# Patient Record
Sex: Male | Born: 1970 | Race: Black or African American | Hispanic: No | Marital: Married | Smoking: Former smoker
Health system: Southern US, Community
[De-identification: ages and names within clinical notes are randomized; demographics above are authoritative.]

## PROBLEM LIST (undated history)

## (undated) DIAGNOSIS — I10 Essential (primary) hypertension: Secondary | ICD-10-CM

## (undated) DIAGNOSIS — T7840XA Allergy, unspecified, initial encounter: Secondary | ICD-10-CM

## (undated) DIAGNOSIS — E7800 Pure hypercholesterolemia, unspecified: Secondary | ICD-10-CM

## (undated) DIAGNOSIS — F528 Other sexual dysfunction not due to a substance or known physiological condition: Secondary | ICD-10-CM

## (undated) HISTORY — DX: Essential (primary) hypertension: I10

## (undated) HISTORY — DX: Pure hypercholesterolemia, unspecified: E78.00

## (undated) HISTORY — DX: Allergy, unspecified, initial encounter: T78.40XA

## (undated) HISTORY — DX: Morbid (severe) obesity due to excess calories: E66.01

## (undated) HISTORY — PX: WISDOM TOOTH EXTRACTION: SHX21

## (undated) HISTORY — DX: Other sexual dysfunction not due to a substance or known physiological condition: F52.8

---

## 2005-06-08 ENCOUNTER — Ambulatory Visit: Payer: Self-pay | Admitting: Internal Medicine

## 2005-07-21 ENCOUNTER — Ambulatory Visit: Payer: Self-pay | Admitting: Internal Medicine

## 2006-01-19 ENCOUNTER — Ambulatory Visit: Payer: Self-pay | Admitting: Internal Medicine

## 2006-07-24 ENCOUNTER — Ambulatory Visit: Payer: Self-pay | Admitting: Internal Medicine

## 2006-07-24 LAB — CONVERTED CEMR LAB
ALT: 22 units/L (ref 0–40)
CO2: 31 meq/L (ref 19–32)
Calcium: 9.6 mg/dL (ref 8.4–10.5)
Chloride: 101 meq/L (ref 96–112)
Chol/HDL Ratio, serum: 7.7
Cholesterol: 284 mg/dL (ref 0–200)
Creatinine, Ser: 1.2 mg/dL (ref 0.4–1.5)
Glucose, Bld: 95 mg/dL (ref 70–99)
HDL: 37 mg/dL — ABNORMAL LOW (ref >39.0)
LDL DIRECT: 231.1 mg/dL
Sodium: 140 meq/L (ref 135–145)
Triglyceride fasting, serum: 108 mg/dL (ref 0–149)
VLDL: 22 mg/dL (ref 0–40)

## 2007-05-10 DIAGNOSIS — I10 Essential (primary) hypertension: Secondary | ICD-10-CM

## 2007-05-10 HISTORY — DX: Essential (primary) hypertension: I10

## 2007-10-07 ENCOUNTER — Telehealth: Payer: Self-pay | Admitting: Internal Medicine

## 2008-04-24 ENCOUNTER — Ambulatory Visit: Payer: Self-pay | Admitting: Internal Medicine

## 2008-04-24 DIAGNOSIS — E7800 Pure hypercholesterolemia, unspecified: Secondary | ICD-10-CM | POA: Insufficient documentation

## 2008-04-24 HISTORY — DX: Pure hypercholesterolemia, unspecified: E78.00

## 2008-04-24 LAB — CONVERTED CEMR LAB
AST: 21 units/L (ref 0–37)
Direct LDL: 157 mg/dL — ABNORMAL HIGH
HDL: 39 mg/dL — ABNORMAL LOW (ref >39)
Triglycerides: 91 mg/dL (ref <150)

## 2008-08-21 ENCOUNTER — Ambulatory Visit: Payer: Self-pay | Admitting: Internal Medicine

## 2008-08-21 DIAGNOSIS — F528 Other sexual dysfunction not due to a substance or known physiological condition: Secondary | ICD-10-CM

## 2008-08-21 HISTORY — DX: Other sexual dysfunction not due to a substance or known physiological condition: F52.8

## 2008-08-25 LAB — CONVERTED CEMR LAB
Cholesterol: 227 mg/dL (ref 0–200)
Direct LDL: 177.6 mg/dL
Testosterone: 586.19 ng/dL (ref 350.00–890)

## 2008-11-18 ENCOUNTER — Telehealth: Payer: Self-pay | Admitting: Internal Medicine

## 2009-09-01 ENCOUNTER — Telehealth: Payer: Self-pay | Admitting: Internal Medicine

## 2009-09-13 ENCOUNTER — Ambulatory Visit: Payer: Self-pay | Admitting: Internal Medicine

## 2009-09-15 LAB — CONVERTED CEMR LAB
AST: 24 units/L (ref 0–37)
Direct LDL: 170 mg/dL
HDL: 26.9 mg/dL — ABNORMAL LOW (ref >39.00)
Triglycerides: 162 mg/dL — ABNORMAL HIGH (ref 0.0–149.0)

## 2009-09-22 ENCOUNTER — Telehealth: Payer: Self-pay | Admitting: Internal Medicine

## 2009-10-11 ENCOUNTER — Telehealth: Payer: Self-pay | Admitting: Internal Medicine

## 2010-10-04 NOTE — Progress Notes (Signed)
Summary: cough  Phone Note Call from Patient   Caller: Patient Call For: Gordy Savers  MD Summary of Call: [facility] Needs refill or different Rx for coughing.  Message left on Triage. [phone] Initial call taken by: Lynann Beaver CMA,  October 11, 2009 11:48 AM  Follow-up for Phone Call        generic Hydromet, 6-ounce, 1 teaspoon every 6 hours as needed for cough Follow-up by: Gordy Savers  MD,  October 11, 2009 12:35 PM     Appended Document: cough Patient called saying Rx is not at [facility] [address]  Called in.

## 2010-10-04 NOTE — Assessment & Plan Note (Signed)
Summary: fu on bp/njr   Vital Signs:  Patient profile:   40 year old male Weight:      288 pounds BP sitting:   150 / 90  (right arm) Cuff size:   large  Vitals Entered By: Raechel Ache, RN (September 13, 2009 8:21 AM) CC: F/u BP.   CC:  F/u BP.Marland Kitchen  History of Present Illness: 40 year old patient who is included for follow-up of his hypertension and dyslipidemia.  He does monitor blood pressures at home with readings from normal range  to  mildly elevated.  he feels well.  No concerns or complaints.  He does have a history of ED controlled well on medication.  He has recovered from a URI.  Allergies: No Known Drug Allergies  Past History:  Past Medical History: Reviewed history from 05/10/2007 and no changes required. Allergies High Cholesterol Hypertension  Review of Systems  The patient denies anorexia, fever, weight loss, weight gain, vision loss, decreased hearing, hoarseness, chest pain, syncope, dyspnea on exertion, peripheral edema, prolonged cough, headaches, hemoptysis, abdominal pain, melena, hematochezia, severe indigestion/heartburn, hematuria, incontinence, genital sores, muscle weakness, suspicious skin lesions, transient blindness, difficulty walking, depression, unusual weight change, abnormal bleeding, enlarged lymph nodes, angioedema, breast masses, and testicular masses.    Physical Exam  General:  overweight-appearing.  blood pressure as high as 160/96overweight-appearing.   Head:  Normocephalic and atraumatic without obvious abnormalities. No apparent alopecia or balding. Eyes:  No corneal or conjunctival inflammation noted. EOMI. Perrla. Funduscopic exam benign, without hemorrhages, exudates or papilledema. Vision grossly normal. Mouth:  Oral mucosa and oropharynx without lesions or exudates.  Teeth in good repair. Neck:  No deformities, masses, or tenderness noted. Lungs:  Normal respiratory effort, chest expands symmetrically. Lungs are clear to  auscultation, no crackles or wheezes. Heart:  Normal rate and regular rhythm. S1 and S2 normal without gallop, murmur, click, rub or other extra sounds. Msk:  No deformity or scoliosis noted of thoracic or lumbar spine.   Pulses:  R and L carotid,radial,femoral,dorsalis pedis and posterior tibial pulses are full and equal bilaterally Extremities:  No clubbing, cyanosis, edema, or deformity noted with normal full range of motion of all joints.     Impression & Recommendations:  Problem # 1:  HYPERTENSION (ICD-401.9)  The following medications were removed from the medication list:    Hydrochlorothiazide 25 Mg Tabs (Hydrochlorothiazide) .Marland Kitchen... 1 once daily His updated medication list for this problem includes:    Lisinopril-hydrochlorothiazide 20-25 Mg Tabs (Lisinopril-hydrochlorothiazide) ..... One daily  The following medications were removed from the medication list:    Hydrochlorothiazide 25 Mg Tabs (Hydrochlorothiazide) .Marland Kitchen... 1 once daily His updated medication list for this problem includes:    Lisinopril-hydrochlorothiazide 20-25 Mg Tabs (Lisinopril-hydrochlorothiazide) ..... One daily  Problem # 2:  PURE HYPERCHOLESTEROLEMIA (ICD-272.0)  His updated medication list for this problem includes:    Simvastatin 40 Mg Tabs (Simvastatin) .Marland Kitchen... 1 at bedtime    His updated medication list for this problem includes:    Simvastatin 40 Mg Tabs (Simvastatin) .Marland Kitchen... 1 at bedtime  Complete Medication List: 1)  Simvastatin 40 Mg Tabs (Simvastatin) .Marland Kitchen.. 1 at bedtime 2)  Cialis 20 Mg Tabs (Tadalafil) .... One daily as directed 3)  Viagra 100 Mg Tabs (Sildenafil citrate) .... 1/2-1 as needed 4)  Lisinopril-hydrochlorothiazide 20-25 Mg Tabs (Lisinopril-hydrochlorothiazide) .... One daily  Other Orders: Venipuncture ([address]) TLB-AST (SGOT) (84450-SGOT) TLB-Lipid Panel (80061-LIPID)  Patient Instructions: 1)  Please schedule a follow-up appointment in 6 months. 2)  Limit  your Sodium  (Salt). 3)  It is important that you exercise regularly at least 20 minutes 5 times a week. If you develop chest pain, have severe difficulty breathing, or feel very tired , stop exercising immediately and seek medical attention. 4)  You need to lose weight. Consider a lower calorie diet and regular exercise.  5)  Check your Blood Pressure regularly. If it is above: 150/90 you should make an appointment. Prescriptions: LISINOPRIL-HYDROCHLOROTHIAZIDE 20-25 MG TABS (LISINOPRIL-HYDROCHLOROTHIAZIDE) one daily  #90 x 6   Entered and Authorized by:   Gordy Savers  MD   Signed by:   Gordy Savers  MD on 09/13/2009   Method used:   Print then Give to Patient   RxID:   [id number] VIAGRA 100 MG TABS (SILDENAFIL CITRATE) 1/2-1 as needed  #6 x 6   Entered and Authorized by:   Gordy Savers  MD   Signed by:   Gordy Savers  MD on 09/13/2009   Method used:   Print then Give to Patient   RxID:   [id number] CIALIS 20 MG TABS (TADALAFIL) one daily as directed  #12 x 6   Entered and Authorized by:   Gordy Savers  MD   Signed by:   Gordy Savers  MD on 09/13/2009   Method used:   Print then Give to Patient   RxID:   [id number] SIMVASTATIN 40 MG  TABS (SIMVASTATIN) 1 at bedtime  #90.0 Each x 6   Entered and Authorized by:   Gordy Savers  MD   Signed by:   Gordy Savers  MD on 09/13/2009   Method used:   Print then Give to Patient   RxID:   [id number]

## 2010-10-04 NOTE — Progress Notes (Signed)
Summary: RX for cough  Phone Note Call from Patient   Caller: Patient Call For: Gordy Savers  MD Summary of Call: Pt had the flu last week, and feels better, but is still coughing.  Would like Rx called to [facility]).  Initial call taken by: Lynann Beaver CMA,  September 22, 2009 3:17 PM    New/Updated Medications: HYDROMET 5-1.5 MG/5ML SYRP (HYDROCODONE-HOMATROPINE) one teaspoon q 6 hrs as needed cough Prescriptions: HYDROMET 5-1.5 MG/5ML SYRP (HYDROCODONE-HOMATROPINE) one teaspoon q 6 hrs as needed cough  #6 oz. x 0   Entered by:   Lynann Beaver CMA   Authorized by:   Gordy Savers  MD   Signed by:   Lynann Beaver CMA on 09/23/2009   Method used:   Telephoned to ...       [facility] [address] [phone]* (retail)       [address]       [place], [place]  [address]       Ph: [phone]       Fax: [phone]   RxID:   [id number]  generic hydromet 6 oz one tsp every 6 and hrs for cough

## 2010-11-07 ENCOUNTER — Encounter: Payer: Self-pay | Admitting: Internal Medicine

## 2010-11-07 ENCOUNTER — Ambulatory Visit (INDEPENDENT_AMBULATORY_CARE_PROVIDER_SITE_OTHER): Payer: Self-pay | Admitting: Internal Medicine

## 2010-11-07 VITALS — BP 130/80 | Temp 99.2°F | Wt 288.0 lb

## 2010-11-07 DIAGNOSIS — I10 Essential (primary) hypertension: Secondary | ICD-10-CM

## 2010-11-07 MED ORDER — PREDNISONE 20 MG PO TABS: 20.0000 mg | ORAL_TABLET | Freq: Every day | ORAL | Status: AC

## 2010-11-07 MED ORDER — FLUTICASONE PROPIONATE 50 MCG/ACT NA SUSP: 1.0000 | Freq: Every day | NASAL | Status: DC

## 2010-11-07 NOTE — Patient Instructions (Signed)
Get plenty of rest, Drink lots of  clear liquids, and use Tylenol or ibuprofen for fever and discomfort.    Call or return to clinic prn if these symptoms worsen or fail to improve as anticipated.  Return in 3 months for follow-up  Please check your blood pressure on a regular basis.  If it is consistently greater than 150/90, please make an office appointment.

## 2010-11-07 NOTE — Progress Notes (Signed)
  Subjective:    Patient ID: Adam Adkins, male    DOB: 07/19/1971, 40 y.o.   MRN: [id number]  HPI   1 -year-old patient who has a history of  Hypertension as well as allergic rhinitis. For the past week he has had increasing sinus congestion and cough he has had low-grade fever. He has been using Zyrtec-D and also nasal decongestants. His blood pressure has been well-controlled. He denies any purulent nasal secretions or purulent productive cough no wheezing he has had some mild sore throat he also has a history of   Review of Systems  Constitutional: Negative for fever, chills, appetite change and fatigue.  HENT: Positive for congestion, rhinorrhea and postnasal drip. Negative for hearing loss, ear pain, sore throat, trouble swallowing, neck stiffness, dental problem, voice change and tinnitus.   Eyes: Negative for pain, discharge and visual disturbance.  Respiratory: Positive for cough. Negative for chest tightness, wheezing and stridor.   Cardiovascular: Negative for chest pain, palpitations and leg swelling.  Gastrointestinal: Negative for nausea, vomiting, abdominal pain, diarrhea, constipation, blood in stool and abdominal distention.  Genitourinary: Negative for urgency, hematuria, flank pain, discharge, difficulty urinating and genital sores.  Musculoskeletal: Negative for myalgias, back pain, joint swelling, arthralgias and gait problem.  Skin: Negative for rash.  Neurological: Negative for dizziness, syncope, speech difficulty, weakness, numbness and headaches.  Hematological: Negative for adenopathy. Does not bruise/bleed easily.  Psychiatric/Behavioral: Negative for behavioral problems and dysphoric mood. The patient is not nervous/anxious.        Objective:   Physical Exam  Constitutional: He is oriented to person, place, and time. He appears well-developed and well-nourished.        Overweight no distress  HENT:  Head: Normocephalic.  Right Ear: External ear normal.  Left  Ear: External ear normal.  Eyes: Conjunctivae and EOM are normal.  Neck: Normal range of motion.  Cardiovascular: Normal rate and normal heart sounds.   Pulmonary/Chest: Breath sounds normal.  Abdominal: Bowel sounds are normal.  Musculoskeletal: Normal range of motion. He exhibits no edema and no tenderness.  Neurological: He is alert and oriented to person, place, and time.  Psychiatric: He has a normal mood and affect. His behavior is normal.          Assessment & Plan:   URI-   We'll treat symptomatically. Samples prescribed  Hypertension stable  Dyslipidemia

## 2010-12-16 ENCOUNTER — Encounter: Payer: Self-pay | Admitting: Internal Medicine

## 2010-12-16 ENCOUNTER — Ambulatory Visit (INDEPENDENT_AMBULATORY_CARE_PROVIDER_SITE_OTHER): Payer: BLUE CROSS/BLUE SHIELD | Admitting: Internal Medicine

## 2010-12-16 DIAGNOSIS — J309 Allergic rhinitis, unspecified: Secondary | ICD-10-CM

## 2010-12-16 DIAGNOSIS — I10 Essential (primary) hypertension: Secondary | ICD-10-CM

## 2010-12-16 NOTE — Progress Notes (Signed)
  Subjective:    Patient ID: Adam Adkins, male    DOB: 12/13/1970, 40 y.o.   MRN: [id number]  HPI 40 year old patient who has a history of hypertension and allergic rhinitis. He was seen here 5 weeks ago with sore throat head and chest congestion. He remains on Flonase and was treated symptomatically for his cough. He continues to have nasal congestion postnasal drip and a chief complaint of cough. There's been no fever   Review of Systems  Constitutional: Positive for fatigue. Negative for fever, chills and appetite change.  HENT: Positive for congestion and rhinorrhea. Negative for hearing loss, ear pain, sore throat, trouble swallowing, neck stiffness, dental problem, voice change and tinnitus.   Eyes: Negative for pain, discharge and visual disturbance.  Respiratory: Positive for cough. Negative for chest tightness, wheezing and stridor.   Cardiovascular: Negative for chest pain, palpitations and leg swelling.  Gastrointestinal: Negative for nausea, vomiting, abdominal pain, diarrhea, constipation, blood in stool and abdominal distention.  Genitourinary: Negative for urgency, hematuria, flank pain, discharge, difficulty urinating and genital sores.  Musculoskeletal: Negative for myalgias, back pain, joint swelling, arthralgias and gait problem.  Skin: Negative for rash.  Neurological: Negative for dizziness, syncope, speech difficulty, weakness, numbness and headaches.  Hematological: Negative for adenopathy. Does not bruise/bleed easily.  Psychiatric/Behavioral: Negative for behavioral problems and dysphoric mood. The patient is not nervous/anxious.        Objective:   Physical Exam  Constitutional: He is oriented to person, place, and time. He appears well-developed.  HENT:  Head: Normocephalic.  Right Ear: External ear normal.  Left Ear: External ear normal.  Eyes: Conjunctivae and EOM are normal.  Neck: Normal range of motion.  Cardiovascular: Normal rate and normal heart  sounds.   Pulmonary/Chest: Breath sounds normal.  Abdominal: Bowel sounds are normal.  Musculoskeletal: Normal range of motion. He exhibits no edema and no tenderness.  Neurological: He is alert and oriented to person, place, and time.  Psychiatric: He has a normal mood and affect. His behavior is normal.          Assessment & Plan:  \ Allergic rhinitis. Pharyngitis. We'll treat with a prednisone Dosepak continue Nasonex and Allegra-D. Will call if unimproved

## 2010-12-16 NOTE — Patient Instructions (Signed)
Prednisone Dosepak as directed  Call or return to clinic prn if these symptoms worsen or fail to improve as anticipated.

## 2010-12-31 ENCOUNTER — Other Ambulatory Visit: Payer: Self-pay | Admitting: Internal Medicine

## 2011-01-10 ENCOUNTER — Telehealth: Payer: Self-pay | Admitting: *Deleted

## 2011-01-10 NOTE — Telephone Encounter (Signed)
Pt is complaining of "a build of mucus in his throat", and wants to know if there is anything he can take for it.  He feels it is related to his allergies. Sounds like he is describing post nasal drainage.

## 2011-01-10 NOTE — Telephone Encounter (Signed)
Suggest alavert daily

## 2011-01-10 NOTE — Telephone Encounter (Signed)
Pt given Dr. Charm Rings recommendations on his personal voice mail.

## 2011-02-02 ENCOUNTER — Encounter: Payer: Self-pay | Admitting: Internal Medicine

## 2011-02-02 ENCOUNTER — Ambulatory Visit (INDEPENDENT_AMBULATORY_CARE_PROVIDER_SITE_OTHER): Payer: BC Managed Care – PPO | Admitting: Internal Medicine

## 2011-02-02 DIAGNOSIS — I10 Essential (primary) hypertension: Secondary | ICD-10-CM

## 2011-02-02 NOTE — Progress Notes (Signed)
  Subjective:    Patient ID: Adam Adkins, male    DOB: August 30, 1971, 40 y.o.   MRN: [id number]  HPI  40 year old patient who presents with a 3 to four-day history of fullness in the left ear. He does have a history of allergic rhinitis and has been on maintenance Flonase as well as Alavert. He has hypertension and treated dyslipidemia. He also describes some fullness in the throat as well as the sinus area. Denies much in the way of cough or postnasal drip. No fever or purulent reductive cough. Denies any wheezing or shortness of breath    Review of Systems  Constitutional: Negative for fever, chills, appetite change and fatigue.  HENT: Positive for ear pain. Negative for hearing loss, congestion, sore throat, trouble swallowing, neck stiffness, dental problem, voice change and tinnitus.   Eyes: Negative for pain, discharge and visual disturbance.  Respiratory: Negative for cough, chest tightness, wheezing and stridor.   Cardiovascular: Negative for chest pain, palpitations and leg swelling.  Gastrointestinal: Negative for nausea, vomiting, abdominal pain, diarrhea, constipation, blood in stool and abdominal distention.  Genitourinary: Negative for urgency, hematuria, flank pain, discharge, difficulty urinating and genital sores.  Musculoskeletal: Negative for myalgias, back pain, joint swelling, arthralgias and gait problem.  Skin: Negative for rash.  Neurological: Negative for dizziness, syncope, speech difficulty, weakness, numbness and headaches.  Hematological: Negative for adenopathy. Does not bruise/bleed easily.  Psychiatric/Behavioral: Negative for behavioral problems and dysphoric mood. The patient is not nervous/anxious.        Objective:   Physical Exam  Constitutional: He is oriented to person, place, and time. He appears well-developed.  HENT:  Head: Normocephalic.  Right Ear: External ear normal.  Left Ear: External ear normal.  Mouth/Throat: Oropharynx is clear and moist.         Left ear is normal  Eyes: Conjunctivae and EOM are normal.  Neck: Normal range of motion.  Cardiovascular: Normal rate and normal heart sounds.   Pulmonary/Chest: Breath sounds normal.  Abdominal: Bowel sounds are normal.  Musculoskeletal: Normal range of motion. He exhibits no edema and no tenderness.  Neurological: He is alert and oriented to person, place, and time.  Psychiatric: He has a normal mood and affect. His behavior is normal.          Assessment & Plan:   Left otalgia. We'll clinically observe at the present time; will add a decongestant to his regimen Hypertension stable

## 2011-02-02 NOTE — Patient Instructions (Signed)
Call or return to clinic prn if these symptoms worsen or fail to improve as anticipated.

## 2011-04-20 ENCOUNTER — Other Ambulatory Visit: Payer: Self-pay | Admitting: Internal Medicine

## 2011-05-10 ENCOUNTER — Ambulatory Visit: Payer: Self-pay | Admitting: Internal Medicine

## 2011-05-11 ENCOUNTER — Telehealth: Payer: Self-pay | Admitting: Internal Medicine

## 2011-05-11 MED ORDER — VARDENAFIL HCL 20 MG PO TABS: 20.0000 mg | ORAL_TABLET | ORAL | Status: DC | PRN

## 2011-05-11 NOTE — Telephone Encounter (Signed)
done

## 2011-05-11 NOTE — Telephone Encounter (Signed)
Pt said that Dr Amador Cunas had given pt some samples of Levitra to try. Pt is req a script to be called in to [facility] on [address]

## 2011-05-11 NOTE — Telephone Encounter (Signed)
Please advise what mg ?

## 2011-05-11 NOTE — Telephone Encounter (Signed)
20 mg  #12  RF 4

## 2011-08-31 ENCOUNTER — Telehealth: Payer: Self-pay | Admitting: *Deleted

## 2011-08-31 NOTE — Telephone Encounter (Signed)
Triage message from pt who is out of town for 2 more weeks.  He has hx of ringworm and has on his arm again.  He has tried OTC tinactin which did help for a time, however it is back.  Pt wife will be joining him this weekend and is requesting another suggestion and or new Rx for ringworm that can picked-up and brought to him. [facility] [address]

## 2011-09-01 NOTE — Telephone Encounter (Signed)
Spoke with pt- informed of dr. kwiatkowski's instructions 

## 2011-09-01 NOTE — Telephone Encounter (Signed)
Try OTC lamisil bid

## 2011-10-06 ENCOUNTER — Ambulatory Visit (INDEPENDENT_AMBULATORY_CARE_PROVIDER_SITE_OTHER): Payer: BC Managed Care – PPO | Admitting: Internal Medicine

## 2011-10-06 ENCOUNTER — Encounter: Payer: Self-pay | Admitting: Internal Medicine

## 2011-10-06 DIAGNOSIS — F528 Other sexual dysfunction not due to a substance or known physiological condition: Secondary | ICD-10-CM

## 2011-10-06 DIAGNOSIS — E7800 Pure hypercholesterolemia, unspecified: Secondary | ICD-10-CM

## 2011-10-06 DIAGNOSIS — J069 Acute upper respiratory infection, unspecified: Secondary | ICD-10-CM

## 2011-10-06 DIAGNOSIS — I10 Essential (primary) hypertension: Secondary | ICD-10-CM

## 2011-10-06 MED ORDER — LISINOPRIL-HYDROCHLOROTHIAZIDE 20-25 MG PO TABS: 1.0000 | ORAL_TABLET | Freq: Every day | ORAL | Status: DC

## 2011-10-06 MED ORDER — SIMVASTATIN 40 MG PO TABS: 40.0000 mg | ORAL_TABLET | Freq: Every day | ORAL | Status: DC

## 2011-10-06 MED ORDER — FLUTICASONE PROPIONATE 50 MCG/ACT NA SUSP: 1.0000 | Freq: Every day | NASAL | Status: DC

## 2011-10-06 MED ORDER — VARDENAFIL HCL 20 MG PO TABS: 20.0000 mg | ORAL_TABLET | ORAL | Status: DC | PRN

## 2011-10-06 MED ORDER — HYDROCODONE-HOMATROPINE 5-1.5 MG/5ML PO SYRP: 5.0000 mL | ORAL_SOLUTION | Freq: Four times a day (QID) | ORAL | Status: DC | PRN

## 2011-10-06 NOTE — Progress Notes (Signed)
  Subjective:    Patient ID: Adam Adkins, male    DOB: 1970/10/24, 41 y.o.   MRN: [id number]  HPI  Wt Readings from Last 3 Encounters:  10/06/11 303 lb (137.44 kg)  02/02/11 295 lb (133.811 kg)  12/16/10 286 lb (129.66 kg)   41 year old patient who has a history of treated hypertension and dyslipidemia. He presents with a two-day history of head and chest congestion and nonproductive cough there's been no fever or purulent sputum production. No shortness breath or chest pain. His medications were reviewed and he has been compliant.  Review of Systems  Constitutional: Negative for fever, chills, appetite change and fatigue.  HENT: Positive for congestion and postnasal drip. Negative for hearing loss, ear pain, sore throat, trouble swallowing, neck stiffness, dental problem, voice change and tinnitus.   Eyes: Negative for pain, discharge and visual disturbance.  Respiratory: Positive for cough. Negative for chest tightness, wheezing and stridor.   Cardiovascular: Negative for chest pain, palpitations and leg swelling.  Gastrointestinal: Negative for nausea, vomiting, abdominal pain, diarrhea, constipation, blood in stool and abdominal distention.  Genitourinary: Negative for urgency, hematuria, flank pain, discharge, difficulty urinating and genital sores.  Musculoskeletal: Negative for myalgias, back pain, joint swelling, arthralgias and gait problem.  Skin: Negative for rash.  Neurological: Negative for dizziness, syncope, speech difficulty, weakness, numbness and headaches.  Hematological: Negative for adenopathy. Does not bruise/bleed easily.  Psychiatric/Behavioral: Negative for behavioral problems and dysphoric mood. The patient is not nervous/anxious.        Objective:   Physical Exam  Constitutional: He is oriented to person, place, and time. He appears well-developed.       Weight 303 Blood pressure 120/78  HENT:  Head: Normocephalic.  Right Ear: External ear normal.  Left  Ear: External ear normal.  Eyes: Conjunctivae and EOM are normal.  Neck: Normal range of motion.  Cardiovascular: Normal rate and normal heart sounds.   Pulmonary/Chest: Breath sounds normal.  Abdominal: Bowel sounds are normal.  Musculoskeletal: Normal range of motion. He exhibits no edema and no tenderness.  Neurological: He is alert and oriented to person, place, and time.  Psychiatric: He has a normal mood and affect. His behavior is normal.          Assessment & Plan:   Viral URI. Will treat symptomatically with Hydromet  Hypertension well controlled  Exogenous obesity. Weight loss exercise encouraged  Dyslipidemia simvastatin refilled   Recheck one year or as needed

## 2011-10-06 NOTE — Patient Instructions (Addendum)
Limit your sodium (Salt) intake    It is important that you exercise regularly, at least 20 minutes 3 to 4 times per week.  If you develop chest pain or shortness of breath seek  medical attention.  You need to lose weight.  Consider a lower calorie diet and regular exercise.  Please check your blood pressure on a regular basis.  If it is consistently greater than 150/90, please make an office appointment.  Return in one year for follow-up  Get plenty of rest, Drink lots of  clear liquids, and use Tylenol or ibuprofen for fever and discomfort.

## 2012-01-22 ENCOUNTER — Telehealth: Payer: Self-pay

## 2012-01-22 MED ORDER — TERBINAFINE HCL 1 % EX CREA: TOPICAL_CREAM | Freq: Two times a day (BID) | CUTANEOUS | Status: DC

## 2012-01-22 NOTE — Telephone Encounter (Signed)
Please advise 

## 2012-01-22 NOTE — Telephone Encounter (Signed)
Lamisil cream applied twice daily for 2 weeks

## 2012-01-22 NOTE — Telephone Encounter (Signed)
Triage VM:  Pt states when pt was in last to see Dr. Amador Cunas he had a ring worm that he was told was clearing up.  Pt states the ring worm is back and the otc medications he has been using are not helping.  Pt would like to know if an rx can be sent to pharmacy.  Pls advise.

## 2012-01-22 NOTE — Telephone Encounter (Signed)
Pharm [facility] [address]

## 2012-01-22 NOTE — Telephone Encounter (Signed)
done

## 2012-01-23 ENCOUNTER — Telehealth: Payer: Self-pay

## 2012-01-23 MED ORDER — FLUCONAZOLE 150 MG PO TABS: ORAL_TABLET | ORAL | Status: DC

## 2012-01-23 NOTE — Telephone Encounter (Signed)
New rx done

## 2012-01-23 NOTE — Telephone Encounter (Signed)
Triage VM:  Pt called and states he went to pick up his rx for Lamisil and the pharmacist told pt that this was the same as what pt was using previously.  Pt states he would like to have another rx sent to pharmacy for something stronger.  Pt states the lamisil was clearing the ring worm up previously but now it is spreading to other places.  Pls advise.

## 2012-01-23 NOTE — Telephone Encounter (Signed)
Please advise 

## 2012-01-23 NOTE — Telephone Encounter (Signed)
Fluconazole 150 mg  #4  2 now and repeat in 1 week

## 2012-01-24 ENCOUNTER — Telehealth: Payer: Self-pay | Admitting: Internal Medicine

## 2012-01-24 MED ORDER — FLUCONAZOLE 150 MG PO TABS: ORAL_TABLET | ORAL | Status: DC

## 2012-01-24 NOTE — Telephone Encounter (Signed)
Pharmacy sent back rx on Diflucan because SIG was questionable. Wife calling now, because they are waiting for the med. Please review SIG and send back to [facility].

## 2012-05-02 ENCOUNTER — Ambulatory Visit (INDEPENDENT_AMBULATORY_CARE_PROVIDER_SITE_OTHER): Payer: BC Managed Care – PPO | Admitting: General Surgery

## 2012-05-02 ENCOUNTER — Encounter (INDEPENDENT_AMBULATORY_CARE_PROVIDER_SITE_OTHER): Payer: Self-pay | Admitting: General Surgery

## 2012-05-02 VITALS — BP 146/78 | HR 88 | Temp 97.6°F | Resp 20 | Ht 67.5 in | Wt 314.0 lb

## 2012-05-02 DIAGNOSIS — Z6841 Body Mass Index (BMI) 40.0 and over, adult: Secondary | ICD-10-CM

## 2012-05-02 NOTE — Patient Instructions (Signed)
Keep exercising. We will start our workup

## 2012-05-02 NOTE — Progress Notes (Signed)
Patient ID: Adam Adkins, male   DOB: [birth year], 41 y.o.   MRN: [id number]  Chief Complaint  Patient presents with  . Weight Loss Surgery    HPI Adam Adkins is a 41 y.o. male.   HPI 41 year old morbidly obese African American male with a BMI of 48.45 is referred by Dr. Amador Cunas for evaluation for weight loss surgery. The patient is specifically and should and laparoscopic adjustable gastric band placement. He states he has struggled all of his adult entire life with a weight loss. Despite numerous attempts for sustained weight loss she has been unsuccessful. He has tried back and sciatic, Weight Watchers, Slim fast, over-the-counter Alli tablets, as well as working out. He was most successful in 2005 when he went on a strict diet and worked out. At that time he lost over 100 pounds; however, he has slowly regained it all back. He still tries to get daily exercise. He wears a pedometer and tries to take 10,000 steps per day.  Past Medical History  Diagnosis Date  . ERECTILE DYSFUNCTION 08/21/2008  . HYPERTENSION 05/10/2007  . Pure hypercholesterolemia 04/24/2008    Past Surgical History  Procedure Date  . Wisdom tooth extraction     Family History  Problem Relation Age of Onset  . Breast cancer Maternal Grandmother   . Prostate cancer Paternal Grandfather     Social History History  Substance Use Topics  . Smoking status: Former Smoker    Quit date: 09/04/1997  . Smokeless tobacco: Never Used  . Alcohol Use: No    No Known Allergies  Current Outpatient Prescriptions  Medication Sig Dispense Refill  . fluconazole (DIFLUCAN) 150 MG tablet Take 2 tablets now and repeat in 1 week  4 tablet  0  . fluticasone (FLONASE) 50 MCG/ACT nasal spray Place 1 spray into the nose daily.  16 g  3  . HYDROcodone-homatropine (HYDROMET) 5-1.5 MG/5ML syrup Take 5 mLs by mouth every 6 (six) hours as needed.  120 mL  1  . lisinopril-hydrochlorothiazide (PRINZIDE,ZESTORETIC) 20-25 MG per  tablet Take 1 tablet by mouth daily.  90 tablet  3  . loratadine (CLARITIN) 10 MG tablet Take 10 mg by mouth daily.        . sildenafil (VIAGRA) 100 MG tablet Take 100 mg by mouth daily as needed.        . simvastatin (ZOCOR) 40 MG tablet Take 1 tablet (40 mg total) by mouth at bedtime.  90 tablet  3  . tadalafil (CIALIS) 20 MG tablet Take 20 mg by mouth daily as needed.        . terbinafine (LAMISIL AT JOCK ITCH) 1 % cream Apply topically 2 (two) times daily.  30 g  0  . vardenafil (LEVITRA) 20 MG tablet Take 1 tablet (20 mg total) by mouth as needed for erectile dysfunction.  12 tablet  4    Review of Systems Review of Systems  Constitutional: Negative for fever, chills, appetite change and unexpected weight change.  HENT: Negative for hearing loss, congestion, trouble swallowing and neck pain.   Eyes: Negative for photophobia and visual disturbance.  Respiratory: Negative for chest tightness and shortness of breath.   Cardiovascular: Negative for chest pain and leg swelling.       No PND, no orthopnea, some mild DOE with exercise  Gastrointestinal: Negative for nausea, vomiting, abdominal pain, diarrhea, constipation and blood in stool.       See HPI  Genitourinary: Negative for dysuria, hematuria and  difficulty urinating.  Musculoskeletal: Negative.        Some ankle pain with working out  Skin: Negative for rash.  Neurological: Negative for dizziness, tremors, seizures, speech difficulty, weakness and light-headedness.  Hematological: Does not bruise/bleed easily.       Some varicose veins; no dvt/pe  Psychiatric/Behavioral: Negative for behavioral problems and confusion.    Blood pressure 146/78, pulse 88, temperature 97.6 F (36.4 C), temperature source Temporal, resp. rate 20, height 5' 7.5" (1.715 m), weight 314 lb (142.429 kg).  Physical Exam Physical Exam  Data Reviewed Dr Amador Cunas office notes  Assessment    Morbid obesity BMI 48.45 Hypertension - on  medications Dislipidemia - on medication Ankle pain    Plan    The patient meets weight loss surgery criteria. I think the patient would be an acceptable candidate for Laparoscopic adjustable gastric band placement.  We discussed laparoscopic adjustable gastric banding. The patient was given Agricultural engineer. We discussed the risk and benefits of surgery including but not limited to bleeding, infection, injury to surrounding structures, blood clot formation such as deep venous thrombosis or pulmonary embolism, need to convert to an open procedure, band slippage, band erosion, failure to loose weight, port complications (leak or flippage), potential need for reoperative surgery, esophageal dilatation, worsening reflux, and vitamin deficiencies. We discussed the typical post operative recovery course. We discussed that their postoperative diet will be modified for several weeks. We specifically talked about the need to be on a liquid diet for one to 2 weeks after surgery. We also discussed the typical postoperative course with a laparoscopic adjustable gastric band and the need for frequent postoperative visits to assess the volume status of the band.  We discussed the typical expected weight loss with a laparoscopic adjustable gastric band. I explained to the patient that they can expect to lose 40-60% of their excess body weight if they are compliant with their postoperative instructions. However I did explain that some patients loose less than 40% and some patients lose more than 60% of their excess body weight.  I explained that the likelihood of improvement in their obesity is good.  I explained to the patient that we will start our evaluation process which includes labs, Upper GI to evaluate stomach and swallowing anatomy, nutritionist consultation, psychiatrist consultation, EKG, CXR, abdominal ultrasound.  I look forward to working with this motivated patient.   Mary Sella. Andrey Campanile, MD,  FACS General, Bariatric, & Minimally Invasive Surgery [facility], [place]        [facility] M 05/02/2012, 5:42 PM

## 2012-05-11 ENCOUNTER — Encounter: Payer: BC Managed Care – PPO | Attending: General Surgery | Admitting: *Deleted

## 2012-05-11 ENCOUNTER — Encounter: Payer: Self-pay | Admitting: *Deleted

## 2012-05-11 VITALS — Ht 67.5 in | Wt 313.1 lb

## 2012-05-11 DIAGNOSIS — Z713 Dietary counseling and surveillance: Secondary | ICD-10-CM | POA: Insufficient documentation

## 2012-05-11 DIAGNOSIS — Z01818 Encounter for other preprocedural examination: Secondary | ICD-10-CM | POA: Insufficient documentation

## 2012-05-11 NOTE — Patient Instructions (Signed)
   Follow Pre-Op Nutrition Goals to prepare for Lapband Surgery.   Call the [facility] at [phone] once you have been given your surgery date to enrolled in the Pre-Op Nutrition Class. You will need to attend this nutrition class 3-4 weeks prior to your surgery. 

## 2012-05-11 NOTE — Progress Notes (Signed)
  Pre-Op Assessment Visit:  Pre-Operative LAGB Surgery  Medical Nutrition Therapy:  Appt start time: 1545   End time:  1630.  Patient was seen on 05/11/2012 for Pre-Operative LAGB Nutrition Assessment. Assessment and letter of approval faxed to [facility] Bariatric Surgery Program coordinator on 05/11/2012.  Approval letter sent to [facility] center and will be available in the chart under the media tab.  Handouts given during visit include:  Pre-Op Goals   Bariatric Surgery Protein Shakes handout  Patient to call for Pre-Op and Post-Op Nutrition Education at the [facility] when surgery is scheduled.

## 2012-05-17 LAB — CBC WITH DIFFERENTIAL/PLATELET
Basophils Relative: 2 % — ABNORMAL HIGH (ref 0–1)
HCT: 42 % (ref 39.0–52.0)
Hemoglobin: 14.2 g/dL (ref 13.0–17.0)
Lymphocytes Relative: 22 % (ref 12–46)
Lymphs Abs: 1.3 10*3/uL (ref 0.7–4.0)
MCHC: 33.8 g/dL (ref 30.0–36.0)
Monocytes Absolute: 0.7 10*3/uL (ref 0.1–1.0)
Monocytes Relative: 12 % (ref 3–12)
Neutro Abs: 3.5 10*3/uL (ref 1.7–7.7)
Neutrophils Relative %: 60 % (ref 43–77)
RBC: 4.74 MIL/uL (ref 4.22–5.81)
WBC: 5.9 10*3/uL (ref 4.0–10.5)

## 2012-05-17 LAB — COMPREHENSIVE METABOLIC PANEL
AST: 24 U/L (ref 0–37)
Albumin: 3.8 g/dL (ref 3.5–5.2)
Alkaline Phosphatase: 46 U/L (ref 39–117)
BUN: 20 mg/dL (ref 6–23)
Calcium: 9.6 mg/dL (ref 8.4–10.5)
Chloride: 100 mEq/L (ref 96–112)
Glucose, Bld: 84 mg/dL (ref 70–99)
Potassium: 4.9 mEq/L (ref 3.5–5.3)
Sodium: 137 mEq/L (ref 135–145)
Total Protein: 7.2 g/dL (ref 6.0–8.3)

## 2012-05-17 LAB — TSH: TSH: 0.758 u[IU]/mL (ref 0.350–4.500)

## 2012-05-17 LAB — LIPID PANEL
LDL Cholesterol: 123 mg/dL — ABNORMAL HIGH (ref 0–99)
Triglycerides: 109 mg/dL (ref <150)

## 2012-05-20 LAB — H. PYLORI ANTIBODY, IGG: H Pylori IgG: <0.4 {ISR}

## 2012-05-21 ENCOUNTER — Encounter: Payer: Self-pay | Admitting: Internal Medicine

## 2012-05-21 ENCOUNTER — Ambulatory Visit (INDEPENDENT_AMBULATORY_CARE_PROVIDER_SITE_OTHER): Payer: BC Managed Care – PPO | Admitting: Internal Medicine

## 2012-05-21 VITALS — BP 100/70 | Temp 98.3°F | Ht 68.0 in | Wt 300.0 lb

## 2012-05-21 DIAGNOSIS — E7800 Pure hypercholesterolemia, unspecified: Secondary | ICD-10-CM

## 2012-05-21 DIAGNOSIS — I10 Essential (primary) hypertension: Secondary | ICD-10-CM

## 2012-05-21 NOTE — Progress Notes (Signed)
  Subjective:    Patient ID: Adam Adkins, male    DOB: [birth year], 41 y.o.   MRN: [id number]  HPI 41 year old patient who is seen today for followup. He is considering lap band Bariatric surgery for exogenous obesity. His weight has been in excess of 300 pounds for some time. Comorbidities include dyslipidemia as well as hypertension. He has made numerous attempts at weight loss over the years including a number of diets;  this has included Weight Watchers, Slim fast and a number of other OTC type diets. He has participated in a number of the health clubs and presently has a gym membership at [employer].  BP Readings from Last 3 Encounters:  05/21/12 100/70  05/02/12 146/78  10/06/11 120/78    Wt Readings from Last 3 Encounters:  05/21/12 300 lb (136.079 kg)  05/11/12 313 lb 1.6 oz (142.021 kg)  05/02/12 314 lb (142.429 kg)    Review of Systems  Constitutional: Negative for fever, chills, appetite change and fatigue.  HENT: Negative for hearing loss, ear pain, congestion, sore throat, trouble swallowing, neck stiffness, dental problem, voice change and tinnitus.   Eyes: Negative for pain, discharge and visual disturbance.  Respiratory: Negative for cough, chest tightness, wheezing and stridor.   Cardiovascular: Negative for chest pain, palpitations and leg swelling.  Gastrointestinal: Negative for nausea, vomiting, abdominal pain, diarrhea, constipation, blood in stool and abdominal distention.  Genitourinary: Negative for urgency, hematuria, flank pain, discharge, difficulty urinating and genital sores.  Musculoskeletal: Negative for myalgias, back pain, joint swelling, arthralgias and gait problem.  Skin: Negative for rash.  Neurological: Negative for dizziness, syncope, speech difficulty, weakness, numbness and headaches.  Hematological: Negative for adenopathy. Does not bruise/bleed easily.  Psychiatric/Behavioral: Negative for behavioral problems and dysphoric mood. The  patient is not nervous/anxious.        Objective:   Physical Exam  Constitutional: He is oriented to person, place, and time. He appears well-developed.       Weight 300 pounds Blood pressure low normal  HENT:  Head: Normocephalic.  Right Ear: External ear normal.  Left Ear: External ear normal.  Eyes: Conjunctivae normal and EOM are normal.  Neck: Normal range of motion.  Cardiovascular: Normal rate and normal heart sounds.   Pulmonary/Chest: Breath sounds normal.  Abdominal: Bowel sounds are normal.  Musculoskeletal: Normal range of motion. He exhibits no edema and no tenderness.  Neurological: He is alert and oriented to person, place, and time.  Psychiatric: He has a normal mood and affect. His behavior is normal.          Assessment & Plan:  Exogenous obesity.  The patient will follow up at the bariatric center. A letter of medical necessity will be dictated. He'll continue his efforts at exercise and weight loss with proper diet recheck 6 months Hypertension stable History dyslipidemia

## 2012-05-21 NOTE — Patient Instructions (Addendum)
Limit your sodium (Salt) intake    It is important that you exercise regularly, at least 20 minutes 3 to 4 times per week.  If you develop chest pain or shortness of breath seek  medical attention.  Return in 6 months for follow-up  

## 2012-05-24 ENCOUNTER — Encounter: Payer: Self-pay | Admitting: Internal Medicine

## 2012-06-04 ENCOUNTER — Ambulatory Visit (HOSPITAL_COMMUNITY)
Admission: RE | Admit: 2012-06-04 | Discharge: 2012-06-04 | Disposition: A | Discharge status: Home / Self Care | Payer: BC Managed Care – PPO | Source: Ambulatory Visit | Attending: General Surgery | Admitting: General Surgery

## 2012-06-04 ENCOUNTER — Other Ambulatory Visit: Payer: Self-pay

## 2012-06-04 DIAGNOSIS — E7800 Pure hypercholesterolemia, unspecified: Secondary | ICD-10-CM | POA: Insufficient documentation

## 2012-06-04 DIAGNOSIS — K449 Diaphragmatic hernia without obstruction or gangrene: Secondary | ICD-10-CM | POA: Insufficient documentation

## 2012-06-04 DIAGNOSIS — I10 Essential (primary) hypertension: Secondary | ICD-10-CM | POA: Insufficient documentation

## 2012-06-04 DIAGNOSIS — Z6841 Body Mass Index (BMI) 40.0 and over, adult: Secondary | ICD-10-CM | POA: Insufficient documentation

## 2012-06-20 ENCOUNTER — Other Ambulatory Visit (INDEPENDENT_AMBULATORY_CARE_PROVIDER_SITE_OTHER): Payer: Self-pay | Admitting: General Surgery

## 2012-07-11 ENCOUNTER — Encounter: Payer: BC Managed Care – PPO | Attending: General Surgery | Admitting: *Deleted

## 2012-07-11 VITALS — Ht 67.5 in | Wt 299.8 lb

## 2012-07-11 DIAGNOSIS — Z01818 Encounter for other preprocedural examination: Secondary | ICD-10-CM | POA: Insufficient documentation

## 2012-07-11 DIAGNOSIS — Z713 Dietary counseling and surveillance: Secondary | ICD-10-CM | POA: Insufficient documentation

## 2012-07-12 ENCOUNTER — Encounter (INDEPENDENT_AMBULATORY_CARE_PROVIDER_SITE_OTHER): Payer: Self-pay | Admitting: General Surgery

## 2012-07-12 ENCOUNTER — Ambulatory Visit (INDEPENDENT_AMBULATORY_CARE_PROVIDER_SITE_OTHER): Payer: BC Managed Care – PPO | Admitting: General Surgery

## 2012-07-12 VITALS — BP 132/76 | HR 74 | Temp 97.6°F | Resp 18 | Ht 68.0 in | Wt 299.0 lb

## 2012-07-12 DIAGNOSIS — I10 Essential (primary) hypertension: Secondary | ICD-10-CM

## 2012-07-12 DIAGNOSIS — Z6841 Body Mass Index (BMI) 40.0 and over, adult: Secondary | ICD-10-CM

## 2012-07-12 DIAGNOSIS — E66813 Obesity, class 3: Secondary | ICD-10-CM

## 2012-07-12 NOTE — Progress Notes (Signed)
Patient ID: Adam Adkins, male   DOB: 08/12/1971, 41 y.o.   MRN: [id number]  Chief Complaint  Patient presents with  . Bariatric Pre-op    Lap band sx 07/22/12    HPI Adam Adkins is a 41 y.o. male.   HPI 41-year-old African American male comes in today for his preoperative appointment. He is currently scheduled to undergo laparoscopic adjustable gastric band placement and possible hiatal hernia repair on November 18. I initially met the patient in the office on August 30. He denies any changes to his medical history since he was last seen. He denies any new surgeries or hospitalizations. He denies any new allergies. He states that he has been doing his preoperative diet plan.  Past Medical History  Diagnosis Date  . ERECTILE DYSFUNCTION 08/21/2008  . HYPERTENSION 05/10/2007  . Pure hypercholesterolemia 04/24/2008  . Morbid obesity     Past Surgical History  Procedure Date  . Wisdom tooth extraction     Family History  Problem Relation Age of Onset  . Breast cancer Maternal Grandmother   . Prostate cancer Paternal Grandfather     Social History History  Substance Use Topics  . Smoking status: Former Smoker -- 0.3 packs/day    Types: Cigarettes    Quit date: 09/04/1998  . Smokeless tobacco: Never Used  . Alcohol Use: No    No Known Allergies  Current Outpatient Prescriptions  Medication Sig Dispense Refill  . fluconazole (DIFLUCAN) 150 MG tablet Take 2 tablets now and repeat in 1 week  4 tablet  0  . fluticasone (FLONASE) 50 MCG/ACT nasal spray Place 1 spray into the nose daily.  16 g  3  . HYDROcodone-homatropine (HYDROMET) 5-1.5 MG/5ML syrup Take 5 mLs by mouth every 6 (six) hours as needed.  120 mL  1  . lisinopril-hydrochlorothiazide (PRINZIDE,ZESTORETIC) 20-25 MG per tablet Take 1 tablet by mouth daily.  90 tablet  3  . loratadine (CLARITIN) 10 MG tablet Take 10 mg by mouth daily.        . sildenafil (VIAGRA) 100 MG tablet Take 100 mg by mouth daily as needed.         . simvastatin (ZOCOR) 40 MG tablet Take 1 tablet (40 mg total) by mouth at bedtime.  90 tablet  3  . tadalafil (CIALIS) 20 MG tablet Take 20 mg by mouth daily as needed.        . vardenafil (LEVITRA) 20 MG tablet Take 1 tablet (20 mg total) by mouth as needed for erectile dysfunction.  12 tablet  4    Review of Systems Review of Systems  Constitutional: Negative for fever, activity change, appetite change, fatigue and unexpected weight change.  HENT: Negative for hearing loss and neck pain.   Eyes: Negative for photophobia and visual disturbance.  Respiratory: Negative for chest tightness, shortness of breath and wheezing.   Cardiovascular: Negative for chest pain, palpitations and leg swelling.  Gastrointestinal: Negative for abdominal pain, diarrhea and constipation.  Genitourinary: Negative for enuresis and difficulty urinating.  Musculoskeletal: Negative for arthralgias.  Neurological: Negative for dizziness, seizures, light-headedness and numbness.  Hematological: Does not bruise/bleed easily.    Blood pressure 132/76, pulse 74, temperature 97.6 F (36.4 C), temperature source Temporal, resp. rate 18, height 5' 8" (1.727 Adkins), weight 299 lb (135.626 kg).  Physical Exam Physical Exam  Vitals reviewed. Constitutional: He is oriented to person, place, and time. He appears well-developed and well-nourished. No distress.       Morbidly   obese  HENT:  Head: Normocephalic and atraumatic.  Right Ear: External ear normal.  Left Ear: External ear normal.  Eyes: Conjunctivae normal are normal.  Neck: No tracheal deviation present. No thyromegaly present.  Cardiovascular: Normal rate, regular rhythm and normal heart sounds.   Pulmonary/Chest: Effort normal and breath sounds normal. No stridor. No respiratory distress. He has no wheezes.  Abdominal: Soft. Bowel sounds are normal. He exhibits no distension. There is no tenderness. There is no rebound.  Musculoskeletal: He exhibits no  edema.  Neurological: He is alert and oriented to person, place, and time.  Skin: Skin is warm and dry. No rash noted. He is not diaphoretic. No erythema.  Psychiatric: He has a normal mood and affect. His behavior is normal. Judgment and thought content normal.    Data Reviewed My Office note from 8/30 Labs from 9/13 - normal cmet, cbc, lipids (ldl 123), nml TSH Abd u/s - no stones CXR UGI - UPPER GI SERIES W/ KUB  Technique: After obtaining a scout radiograph a single-column  upper GI series was performed using thin barium.  Fluoroscopy time: 1.6 minutes  Findings: The scout radiograph shows a normal bowel gas pattern.  A small sliding hiatal hernia is seen. There is no evidence of  esophageal mass or stricture. No gastroesophageal reflux was seen  during the exam. Esophageal motility is within normal limits.  The stomach is otherwise normal in appearance. There is no  evidence of gastric masses or ulcers. Duodenal bulb and sweep are  normal in appearance.   IMPRESSION:  Small sliding hiatal hernia. No evidence of esophageal stricture  or other significant abnormality.   Assessment    Morbid obesity BMI 45.5 Hypertension Dislipidemia Small Hiatal hernia    Plan    The patient is currently scheduled for laparoscopic adjustable gastric band placement surgery as well as possible hiatal hernia repair on November 18. I explained that his hiatal hernia is very small and may not need to be fixed at the time of surgery. I explained that we would test to see how large the defect was during surgery. I congratulated him on his weight loss date. He has lost approximately 15 pounds since I saw him initially. I encouraged him to continue with his preoperative diet plan. Also encouraged him to continue with his daily exercise. All of his questions were asked and answered. We reviewed the surgery.  Essa Wenk Adkins. Taran Haynesworth, MD, FACS General, Bariatric, & Minimally Invasive Surgery [facility] [place]        Adam Adkins 07/12/2012, 11:47 AM    

## 2012-07-12 NOTE — Patient Instructions (Signed)
Keep up the great work with your PreOP diet plan and weight loss

## 2012-07-13 ENCOUNTER — Encounter: Payer: Self-pay | Admitting: *Deleted

## 2012-07-13 NOTE — Progress Notes (Signed)
Bariatric Class:  Appt start time: 1730 end time:  1830.  Pre-Operative Nutrition Class  Patient was seen on 07/11/12 for Pre-Operative Bariatric Surgery Education at [facility].   Surgery date: 07/22/12 Surgery type: LAGB Start weight at [facility]: 313.1 lbs (05/11/12) Weight today: 299.8 lbs   Samples given per MNT protocol: Bariatric Advantage Complete Multivitamin Lot # [id number]; [id number] Exp: 12/13; 06/15  Bariatric Advantage Calcium Citrate Lot # [id number] Exp:12/13  Celebrate Vitamins Multivitamin Lot # [id number] Exp: 09/14  Celebrate Vitamins Multivitamin Complete Lot # [id number] Exp: 11/14  Celebrate Vitamins Calcium Citrate Lot # [id number] Exp:09/15  Celebrate Vitamins Sublingual B12 Lot # [id number] Exp: 05/15  Corliss Marcus Protein Powder Lot # [id number] Exp: 03/15  The following the learning objective met by the patient during this course:  Identifies Pre-Op Dietary Goals and will begin 2 weeks pre-operatively  Identifies appropriate sources of fluids and proteins   States protein recommendations and appropriate sources pre and post-operatively  Identifies Post-Operative Dietary Goals and will follow for 2 weeks post-operatively  Identifies appropriate multivitamin and calcium sources  Describes the need for physical activity post-operatively and will follow MD recommendations  States when to call healthcare provider regarding medication questions or post-operative complications  Handouts given during class include:  Pre-Op Bariatric Surgery Diet Handout  Protein Shake Handout  Post-Op Bariatric Surgery Nutrition Handout  BELT Program Information Flyer  Support Group Information Flyer  [facility] Bariatric Supplements Price List  Follow-Up Plan: Patient will follow-up at [facility] 2 weeks post operatively for diet advancement per MD.

## 2012-07-13 NOTE — Patient Instructions (Signed)
Follow:   Pre-Op Diet per MD 2 weeks prior to surgery  Phase 2- Liquids (clear/full) 2 weeks after surgery  Vitamin/Mineral/Calcium guidelines for purchasing bariatric supplements  Exercise guidelines pre and post-op per MD  Follow-up at [facility] in 2 weeks post-op for diet advancement. Contact Malakai Schoenherr as needed with questions/concerns. 

## 2012-07-15 ENCOUNTER — Encounter (HOSPITAL_COMMUNITY): Payer: Self-pay

## 2012-07-16 NOTE — Patient Instructions (Addendum)
[address]  07/16/2012   Your procedure is scheduled on:  11-18 -2013  Report to [facility] at    0900    AM.  Call this number if you have problems the morning of surgery: [phone]  Or Presurgical Testing [phone](Ross Hefferan)   Remember:   Do not eat food:After Midnight.  May have clear liquids:up to 6 Hours before arrival. Nothing after :  Clear liquids include soda, tea, black coffee, apple or grape juice, broth.0500 AM  Take these medicines the morning of surgery with A SIP OF WATER:  Loratadine. Flonase nasal spray use and bring.  Do not wear jewelry, make-up or nail polish.  Do not wear lotions, powders, or perfumes. You may wear deodorant.  Do not shave 48 hours prior to surgery.(face and neck okay, no shaving of legs)  Do not bring valuables to the hospital.  Contacts, dentures or bridgework may not be worn into surgery.  Leave suitcase in the car. After surgery it may be brought to your room.  For patients admitted to the hospital, checkout time is 11:00 AM the day of discharge.   Patients discharged the day of surgery will not be allowed to drive home. Must have responsible person with you x 24 hours once discharged.  Name and phone number of your driver: Hughey Strehle ,spouse [phone]  Special Instructions: CHG Shower Use Special Wash: see special instruction sheet.(avoid face and genitals)   Please read over the following fact sheets that you were given: MRSA Information. Incentive Spirometry Instruction.

## 2012-07-16 NOTE — Pre-Procedure Instructions (Addendum)
07-17-12 EKG/CXR (06-04-12)-in Epic.Your Pt has screened with an elevated risk for obstructive sleep apnea using the Stop-Bang tool during a presurgical  Visit. A score of four or greater is an elevated risk.

## 2012-07-17 ENCOUNTER — Encounter (HOSPITAL_COMMUNITY): Payer: Self-pay

## 2012-07-17 ENCOUNTER — Encounter (HOSPITAL_COMMUNITY)
Admission: RE | Admit: 2012-07-17 | Discharge: 2012-07-17 | Disposition: A | Discharge status: Home / Self Care | Payer: BC Managed Care – PPO | Source: Ambulatory Visit | Attending: General Surgery | Admitting: General Surgery

## 2012-07-17 LAB — CBC WITH DIFFERENTIAL/PLATELET
Eosinophils Absolute: 0.3 10*3/uL (ref 0.0–0.7)
Eosinophils Relative: 5 % (ref 0–5)
Hemoglobin: 15.1 g/dL (ref 13.0–17.0)
Lymphocytes Relative: 21 % (ref 12–46)
Lymphs Abs: 1.2 10*3/uL (ref 0.7–4.0)
MCH: 29.9 pg (ref 26.0–34.0)
MCV: 85.7 fL (ref 78.0–100.0)
Monocytes Relative: 14 % — ABNORMAL HIGH (ref 3–12)
RBC: 5.05 MIL/uL (ref 4.22–5.81)

## 2012-07-17 LAB — COMPREHENSIVE METABOLIC PANEL
Albumin: 3.9 g/dL (ref 3.5–5.2)
BUN: 16 mg/dL (ref 6–23)
CO2: 28 mEq/L (ref 19–32)
Calcium: 10.1 mg/dL (ref 8.4–10.5)
Chloride: 95 mEq/L — ABNORMAL LOW (ref 96–112)
Creatinine, Ser: 1.13 mg/dL (ref 0.50–1.35)
GFR calc non Af Amer: 78 mL/min — ABNORMAL LOW (ref >90)
Total Bilirubin: 0.4 mg/dL (ref 0.3–1.2)

## 2012-07-17 NOTE — Progress Notes (Signed)
07/17/12 0827  OBSTRUCTIVE SLEEP APNEA  Have you ever been diagnosed with sleep apnea through a sleep study? Yes  If yes, do you have and use a CPAP or BPAP machine every night? 0  Do you snore loudly (loud enough to be heard through closed doors)?  0  Do you often feel tired, fatigued, or sleepy during the daytime? 0  Has anyone observed you stop breathing during your sleep? 0  Do you have, or are you being treated for high blood pressure? 1  BMI more than 35 kg/m2? 1  Age over 37 years old? 0  Neck circumference greater than 40 cm/18 inches? 1  Gender: 1  Obstructive Sleep Apnea Score 4   Score 4 or greater  Results sent to PCP

## 2012-07-22 ENCOUNTER — Encounter (HOSPITAL_COMMUNITY)
Admission: RE | Disposition: A | Discharge status: Home / Self Care | Payer: Self-pay | Source: Ambulatory Visit | Attending: General Surgery

## 2012-07-22 ENCOUNTER — Ambulatory Visit (HOSPITAL_COMMUNITY): Payer: BC Managed Care – PPO | Admitting: Anesthesiology

## 2012-07-22 ENCOUNTER — Encounter (HOSPITAL_COMMUNITY): Payer: Self-pay

## 2012-07-22 ENCOUNTER — Encounter (HOSPITAL_COMMUNITY): Payer: Self-pay | Admitting: Anesthesiology

## 2012-07-22 ENCOUNTER — Ambulatory Visit (HOSPITAL_COMMUNITY): Payer: BC Managed Care – PPO

## 2012-07-22 ENCOUNTER — Ambulatory Visit (HOSPITAL_COMMUNITY)
Admission: RE | Admit: 2012-07-22 | Discharge: 2012-07-22 | Disposition: A | Discharge status: Home / Self Care | Payer: BC Managed Care – PPO | Source: Ambulatory Visit | Attending: General Surgery | Admitting: General Surgery

## 2012-07-22 DIAGNOSIS — I10 Essential (primary) hypertension: Secondary | ICD-10-CM | POA: Insufficient documentation

## 2012-07-22 DIAGNOSIS — E7800 Pure hypercholesterolemia, unspecified: Secondary | ICD-10-CM | POA: Insufficient documentation

## 2012-07-22 DIAGNOSIS — K449 Diaphragmatic hernia without obstruction or gangrene: Secondary | ICD-10-CM | POA: Insufficient documentation

## 2012-07-22 DIAGNOSIS — Z6841 Body Mass Index (BMI) 40.0 and over, adult: Secondary | ICD-10-CM | POA: Insufficient documentation

## 2012-07-22 DIAGNOSIS — Z01812 Encounter for preprocedural laboratory examination: Secondary | ICD-10-CM | POA: Insufficient documentation

## 2012-07-22 DIAGNOSIS — Z79899 Other long term (current) drug therapy: Secondary | ICD-10-CM | POA: Insufficient documentation

## 2012-07-22 HISTORY — PX: LAPAROSCOPIC GASTRIC BANDING: SHX1100

## 2012-07-22 HISTORY — PX: HIATAL HERNIA REPAIR: SHX195

## 2012-07-22 SURGERY — GASTRIC BANDING, LAPAROSCOPIC
Anesthesia: General | Site: Abdomen | Wound class: Clean

## 2012-07-22 MED ORDER — LIDOCAINE HCL (CARDIAC) 20 MG/ML IV SOLN
INTRAVENOUS | Status: DC | PRN
  Administered 2012-07-22: 50 mg via INTRAVENOUS

## 2012-07-22 MED ORDER — MEPERIDINE HCL 50 MG/ML IJ SOLN: 6.2500 mg | INTRAMUSCULAR | Status: DC | PRN

## 2012-07-22 MED ORDER — SODIUM CHLORIDE 0.9 % IJ SOLN
INTRAMUSCULAR | Status: DC | PRN
  Administered 2012-07-22: 20 mL via INTRAVENOUS

## 2012-07-22 MED ORDER — ONDANSETRON HCL 4 MG/2ML IJ SOLN
INTRAMUSCULAR | Status: DC | PRN
  Administered 2012-07-22: 4 mg via INTRAVENOUS

## 2012-07-22 MED ORDER — OXYCODONE-ACETAMINOPHEN 5-325 MG/5ML PO SOLN: 5.0000 mL | ORAL | Status: DC | PRN

## 2012-07-22 MED ORDER — FENTANYL CITRATE 0.05 MG/ML IJ SOLN
INTRAMUSCULAR | Status: DC | PRN
  Administered 2012-07-22: 100 ug via INTRAVENOUS
  Administered 2012-07-22: 50 ug via INTRAVENOUS
  Administered 2012-07-22: 100 ug via INTRAVENOUS
  Administered 2012-07-22: 50 ug via INTRAVENOUS
  Administered 2012-07-22: 100 ug via INTRAVENOUS

## 2012-07-22 MED ORDER — DEXTROSE 5 % IV SOLN
2.0000 g | INTRAVENOUS | Status: AC
  Administered 2012-07-22: 2 g via INTRAVENOUS
  Filled 2012-07-22: qty 2

## 2012-07-22 MED ORDER — HYDROMORPHONE HCL PF 1 MG/ML IJ SOLN: 0.2500 mg | INTRAMUSCULAR | Status: DC | PRN

## 2012-07-22 MED ORDER — ROCURONIUM BROMIDE 100 MG/10ML IV SOLN
INTRAVENOUS | Status: DC | PRN
  Administered 2012-07-22: 30 mg via INTRAVENOUS

## 2012-07-22 MED ORDER — ACETAMINOPHEN 10 MG/ML IV SOLN
INTRAVENOUS | Status: DC | PRN
  Administered 2012-07-22: 1000 mg via INTRAVENOUS

## 2012-07-22 MED ORDER — LACTATED RINGERS IV SOLN: INTRAVENOUS | Status: DC

## 2012-07-22 MED ORDER — OXYCODONE-ACETAMINOPHEN 5-325 MG/5ML PO SOLN
5.0000 mL | ORAL | Status: DC | PRN
  Administered 2012-07-22: 5 mL via ORAL
  Filled 2012-07-22: qty 5

## 2012-07-22 MED ORDER — LACTATED RINGERS IV SOLN
INTRAVENOUS | Status: DC
  Administered 2012-07-22: 1000 mL via INTRAVENOUS
  Administered 2012-07-22 (×2): via INTRAVENOUS

## 2012-07-22 MED ORDER — PROPOFOL 10 MG/ML IV BOLUS
INTRAVENOUS | Status: DC | PRN
  Administered 2012-07-22: 200 mg via INTRAVENOUS

## 2012-07-22 MED ORDER — MIDAZOLAM HCL 5 MG/5ML IJ SOLN
INTRAMUSCULAR | Status: DC | PRN
  Administered 2012-07-22: 2 mg via INTRAVENOUS

## 2012-07-22 MED ORDER — PROMETHAZINE HCL 25 MG/ML IJ SOLN: 6.2500 mg | INTRAMUSCULAR | Status: DC | PRN

## 2012-07-22 MED ORDER — HEPARIN SODIUM (PORCINE) 5000 UNIT/ML IJ SOLN
5000.0000 [IU] | INTRAMUSCULAR | Status: AC
  Administered 2012-07-22: 5000 [IU] via SUBCUTANEOUS
  Filled 2012-07-22: qty 1

## 2012-07-22 MED ORDER — METOCLOPRAMIDE HCL 5 MG/ML IJ SOLN
INTRAMUSCULAR | Status: DC | PRN
  Administered 2012-07-22: 10 mg via INTRAVENOUS

## 2012-07-22 MED ORDER — PHENYLEPHRINE HCL 10 MG/ML IJ SOLN
INTRAMUSCULAR | Status: DC | PRN
  Administered 2012-07-22: 80 ug via INTRAVENOUS

## 2012-07-22 MED ORDER — SUCCINYLCHOLINE CHLORIDE 20 MG/ML IJ SOLN
INTRAMUSCULAR | Status: DC | PRN
  Administered 2012-07-22: 180 mg via INTRAVENOUS

## 2012-07-22 SURGICAL SUPPLY — 60 items
ADH SKN CLS APL DERMABOND .7 (GAUZE/BANDAGES/DRESSINGS)
APL SKNCLS STERI-STRIP NONHPOA (GAUZE/BANDAGES/DRESSINGS)
BAND LAP 10.0 W/TUBES (Band) ×1 IMPLANT
BENZOIN TINCTURE PRP APPL 2/3 (GAUZE/BANDAGES/DRESSINGS) IMPLANT
BLADE HEX COATED 2.75 (ELECTRODE) ×2 IMPLANT
BLADE SURG 15 STRL LF DISP TIS (BLADE) ×1 IMPLANT
BLADE SURG 15 STRL SS (BLADE) ×2
BLADE SURG SZ11 CARB STEEL (BLADE) ×2 IMPLANT
CANISTER SUCTION 2500CC (MISCELLANEOUS) ×2 IMPLANT
CHLORAPREP W/TINT 26ML (MISCELLANEOUS) ×3 IMPLANT
CLOTH BEACON ORANGE TIMEOUT ST (SAFETY) ×2 IMPLANT
DECANTER SPIKE VIAL GLASS SM (MISCELLANEOUS) ×3 IMPLANT
DERMABOND ADVANCED (GAUZE/BANDAGES/DRESSINGS)
DERMABOND ADVANCED .7 DNX12 (GAUZE/BANDAGES/DRESSINGS) IMPLANT
DEVICE SUT QUICK LOAD TK 5 (STAPLE) ×8 IMPLANT
DEVICE SUT TI-KNOT TK 5X26 (MISCELLANEOUS) ×2 IMPLANT
DEVICE SUTURE ENDOST 10MM (ENDOMECHANICALS) ×1 IMPLANT
DISSECTOR BLUNT TIP ENDO 5MM (MISCELLANEOUS) IMPLANT
DRAPE CAMERA CLOSED 9X96 (DRAPES) ×2 IMPLANT
DRAPE UTILITY XL STRL (DRAPES) ×5 IMPLANT
ELECT REM PT RETURN 9FT ADLT (ELECTROSURGICAL) ×2
ELECTRODE REM PT RTRN 9FT ADLT (ELECTROSURGICAL) ×1 IMPLANT
GLOVE BIO SURGEON STRL SZ7.5 (GLOVE) ×2 IMPLANT
GLOVE BIOGEL M STRL SZ7.5 (GLOVE) IMPLANT
GLOVE INDICATOR 8.0 STRL GRN (GLOVE) ×2 IMPLANT
GOWN STRL NON-REIN LRG LVL3 (GOWN DISPOSABLE) ×2 IMPLANT
GOWN STRL REIN XL XLG (GOWN DISPOSABLE) ×5 IMPLANT
HOVERMATT SINGLE USE (MISCELLANEOUS) ×2 IMPLANT
KIT BASIN OR (CUSTOM PROCEDURE TRAY) ×2 IMPLANT
MESH HERNIA 1X4 RECT BARD (Mesh General) IMPLANT
MESH HERNIA BARD 1X4 (Mesh General) ×1 IMPLANT
NDL SPNL 22GX3.5 QUINCKE BK (NEEDLE) ×1 IMPLANT
NEEDLE SPNL 22GX3.5 QUINCKE BK (NEEDLE) ×2 IMPLANT
NS IRRIG 1000ML POUR BTL (IV SOLUTION) ×2 IMPLANT
PACK UNIVERSAL I (CUSTOM PROCEDURE TRAY) ×2 IMPLANT
PENCIL BUTTON HOLSTER BLD 10FT (ELECTRODE) ×2 IMPLANT
SCALPEL HARMONIC ACE (MISCELLANEOUS) ×1 IMPLANT
SET IRRIG TUBING LAPAROSCOPIC (IRRIGATION / IRRIGATOR) IMPLANT
SOLUTION ANTI FOG 6CC (MISCELLANEOUS) ×2 IMPLANT
SPONGE LAP 18X18 X RAY DECT (DISPOSABLE) ×2 IMPLANT
STAPLER VISISTAT 35W (STAPLE) IMPLANT
STRIP CLOSURE SKIN 1/2X4 (GAUZE/BANDAGES/DRESSINGS) IMPLANT
SUT ETHIBOND 2 0 SH (SUTURE) ×6
SUT ETHIBOND 2 0 SH 36X2 (SUTURE) ×3 IMPLANT
SUT MNCRL AB 4-0 PS2 18 (SUTURE) ×2 IMPLANT
SUT PROLENE 2 0 CT2 30 (SUTURE) ×2 IMPLANT
SUT SILK 0 (SUTURE) ×2
SUT SILK 0 30XBRD TIE 6 (SUTURE) ×1 IMPLANT
SUT SURGIDAC NAB ES-9 0 48 120 (SUTURE) ×2 IMPLANT
SUT VIC AB 2-0 SH 27 (SUTURE) ×2
SUT VIC AB 2-0 SH 27X BRD (SUTURE) ×1 IMPLANT
SYR 20CC LL (SYRINGE) ×2 IMPLANT
SYR CONTROL 10ML LL (SYRINGE) ×2 IMPLANT
SYS KII OPTICAL ACCESS 15MM (TROCAR) ×2
SYSTEM KII OPTICAL ACCESS 15MM (TROCAR) ×1 IMPLANT
TOWEL OR 17X26 10 PK STRL BLUE (TOWEL DISPOSABLE) ×2 IMPLANT
TROCAR BLADELESS OPT 5 100 (ENDOMECHANICALS) ×2 IMPLANT
TROCAR Z-THREAD FIOS 5X100MM (TROCAR) ×5 IMPLANT
TUBE CALIBRATION LAPBAND (TUBING) ×2 IMPLANT
TUBING INSUFFLATION 10FT LAP (TUBING) ×2 IMPLANT

## 2012-07-22 NOTE — Anesthesia Preprocedure Evaluation (Signed)
Anesthesia Evaluation  Patient identified by MRN, date of birth, ID band Patient awake    Reviewed: Allergy & Precautions, H&P , NPO status , Patient's Chart, lab work & pertinent test results  Airway Mallampati: II TM Distance: >3 FB Neck ROM: Full    Dental No notable dental hx.    Pulmonary neg pulmonary ROS,  breath sounds clear to auscultation  Pulmonary exam normal       Cardiovascular hypertension, Pt. on medications negative cardio ROS  Rhythm:Regular Rate:Normal     Neuro/Psych negative neurological ROS  negative psych ROS   GI/Hepatic negative GI ROS, Neg liver ROS,   Endo/Other  negative endocrine ROSMorbid obesity  Renal/GU negative Renal ROS  negative genitourinary   Musculoskeletal negative musculoskeletal ROS (+)   Abdominal   Peds negative pediatric ROS (+)  Hematology negative hematology ROS (+)   Anesthesia Other Findings   Reproductive/Obstetrics negative OB ROS                          Anesthesia Physical Anesthesia Plan  ASA: III  Anesthesia Plan: General   Post-op Pain Management:    Induction: Intravenous  Airway Management Planned: Oral ETT  Additional Equipment:   Intra-op Plan:   Post-operative Plan: Extubation in OR  Informed Consent: I have reviewed the patients History and Physical, chart, labs and discussed the procedure including the risks, benefits and alternatives for the proposed anesthesia with the patient or authorized representative who has indicated his/her understanding and acceptance.   Dental advisory given  Plan Discussed with: CRNA  Anesthesia Plan Comments:         Anesthesia Quick Evaluation  

## 2012-07-22 NOTE — H&P (View-Only) (Signed)
Patient ID: Adam Adkins, male   DOB: December 29, 1970, 41 y.o.   MRN: [id number]  Chief Complaint  Patient presents with  . Bariatric Pre-op    Lap band sx 07/22/12    HPI Adam Adkins is a 41 y.o. male.   HPI 41 year old [place] American male comes in today for his preoperative appointment. He is currently scheduled to undergo laparoscopic adjustable gastric band placement and possible hiatal hernia repair on November 18. I initially met the patient in the office on August 30. He denies any changes to his medical history since he was last seen. He denies any new surgeries or hospitalizations. He denies any new allergies. He states that he has been doing his preoperative diet plan.  Past Medical History  Diagnosis Date  . ERECTILE DYSFUNCTION 08/21/2008  . HYPERTENSION 05/10/2007  . Pure hypercholesterolemia 04/24/2008  . Morbid obesity     Past Surgical History  Procedure Date  . Wisdom tooth extraction     Family History  Problem Relation Age of Onset  . Breast cancer Maternal Grandmother   . Prostate cancer Paternal Grandfather     Social History History  Substance Use Topics  . Smoking status: Former Smoker -- 0.3 packs/day    Types: Cigarettes    Quit date: 09/04/1998  . Smokeless tobacco: Never Used  . Alcohol Use: No    No Known Allergies  Current Outpatient Prescriptions  Medication Sig Dispense Refill  . fluconazole (DIFLUCAN) 150 MG tablet Take 2 tablets now and repeat in 1 week  4 tablet  0  . fluticasone (FLONASE) 50 MCG/ACT nasal spray Place 1 spray into the nose daily.  16 g  3  . HYDROcodone-homatropine (HYDROMET) 5-1.5 MG/5ML syrup Take 5 mLs by mouth every 6 (six) hours as needed.  120 mL  1  . lisinopril-hydrochlorothiazide (PRINZIDE,ZESTORETIC) 20-25 MG per tablet Take 1 tablet by mouth daily.  90 tablet  3  . loratadine (CLARITIN) 10 MG tablet Take 10 mg by mouth daily.        . sildenafil (VIAGRA) 100 MG tablet Take 100 mg by mouth daily as needed.         . simvastatin (ZOCOR) 40 MG tablet Take 1 tablet (40 mg total) by mouth at bedtime.  90 tablet  3  . tadalafil (CIALIS) 20 MG tablet Take 20 mg by mouth daily as needed.        . vardenafil (LEVITRA) 20 MG tablet Take 1 tablet (20 mg total) by mouth as needed for erectile dysfunction.  12 tablet  4    Review of Systems Review of Systems  Constitutional: Negative for fever, activity change, appetite change, fatigue and unexpected weight change.  HENT: Negative for hearing loss and neck pain.   Eyes: Negative for photophobia and visual disturbance.  Respiratory: Negative for chest tightness, shortness of breath and wheezing.   Cardiovascular: Negative for chest pain, palpitations and leg swelling.  Gastrointestinal: Negative for abdominal pain, diarrhea and constipation.  Genitourinary: Negative for enuresis and difficulty urinating.  Musculoskeletal: Negative for arthralgias.  Neurological: Negative for dizziness, seizures, light-headedness and numbness.  Hematological: Does not bruise/bleed easily.    Blood pressure 132/76, pulse 74, temperature 97.6 F (36.4 C), temperature source Temporal, resp. rate 18, height 5\' 8"  (1.727 m), weight 299 lb (135.626 kg).  Physical Exam Physical Exam  Vitals reviewed. Constitutional: He is oriented to person, place, and time. He appears well-developed and well-nourished. No distress.       Morbidly  obese  HENT:  Head: Normocephalic and atraumatic.  Right Ear: External ear normal.  Left Ear: External ear normal.  Eyes: Conjunctivae normal are normal.  Neck: No tracheal deviation present. No thyromegaly present.  Cardiovascular: Normal rate, regular rhythm and normal heart sounds.   Pulmonary/Chest: Effort normal and breath sounds normal. No stridor. No respiratory distress. He has no wheezes.  Abdominal: Soft. Bowel sounds are normal. He exhibits no distension. There is no tenderness. There is no rebound.  Musculoskeletal: He exhibits no  edema.  Neurological: He is alert and oriented to person, place, and time.  Skin: Skin is warm and dry. No rash noted. He is not diaphoretic. No erythema.  Psychiatric: He has a normal mood and affect. His behavior is normal. Judgment and thought content normal.    Data Reviewed My Office note from 8/30 Labs from 9/13 - normal cmet, cbc, lipids (ldl 123), nml TSH Abd u/s - no stones CXR UGI - UPPER GI SERIES W/ KUB  Technique: After obtaining a scout radiograph a single-column  upper GI series was performed using thin barium.  Fluoroscopy time: 1.6 minutes  Findings: The scout radiograph shows a normal bowel gas pattern.  A small sliding hiatal hernia is seen. There is no evidence of  esophageal mass or stricture. No gastroesophageal reflux was seen  during the exam. Esophageal motility is within normal limits.  The stomach is otherwise normal in appearance. There is no  evidence of gastric masses or ulcers. Duodenal bulb and sweep are  normal in appearance.   IMPRESSION:  Small sliding hiatal hernia. No evidence of esophageal stricture  or other significant abnormality.   Assessment    Morbid obesity BMI 45.5 Hypertension Dislipidemia Small Hiatal hernia    Plan    The patient is currently scheduled for laparoscopic adjustable gastric band placement surgery as well as possible hiatal hernia repair on November 18. I explained that his hiatal hernia is very small and may not need to be fixed at the time of surgery. I explained that we would test to see how large the defect was during surgery. I congratulated him on his weight loss date. He has lost approximately 15 pounds since I saw him initially. I encouraged him to continue with his preoperative diet plan. Also encouraged him to continue with his daily exercise. All of his questions were asked and answered. We reviewed the surgery.  Mary Sella. Andrey Campanile, MD, FACS General, Bariatric, & Minimally Invasive Surgery [facility]  Surgery, [place]        [facility] M 07/12/2012, 11:47 AM

## 2012-07-22 NOTE — Interval H&P Note (Signed)
History and Physical Interval Note:  07/22/2012 11:20 AM  Adam Adkins  has presented today for surgery, with the diagnosis of MORBID OBESITY  hiatal hernia   The various methods of treatment have been discussed with the patient and family. After consideration of risks, benefits and other options for treatment, the patient has consented to  Procedure(s) (LRB) with comments: LAPAROSCOPIC GASTRIC BANDING (N/A) HERNIA REPAIR HIATAL (N/A) as a surgical intervention .  The patient's history has been reviewed, patient examined, no change in status, stable for surgery.  I have reviewed the patient's chart and labs.  Questions were answered to the patient's satisfaction.    Mary Sella. Andrey Campanile, MD, FACS General, Bariatric, & Minimally Invasive Surgery [facility] Surgery, [place]   [facility] M

## 2012-07-22 NOTE — Anesthesia Postprocedure Evaluation (Signed)
  Anesthesia Post-op Note  Patient: Adam Adkins  Procedure(s) Performed: Procedure(s) (LRB): LAPAROSCOPIC GASTRIC BANDING (N/A) HERNIA REPAIR HIATAL (N/A)  Patient Location: PACU  Anesthesia Type: General  Level of Consciousness: awake and alert   Airway and Oxygen Therapy: Patient Spontanous Breathing  Post-op Pain: mild  Post-op Assessment: Post-op Vital signs reviewed, Patient's Cardiovascular Status Stable, Respiratory Function Stable, Patent Airway and No signs of Nausea or vomiting  Post-op Vital Signs: stable  Complications: No apparent anesthesia complications

## 2012-07-22 NOTE — Op Note (Signed)
Laparoscopic Adjustable Gastric Band (AP-Standard) Placement and Hiatal Hernia Operative Note   Pre-operative Diagnosis: Morbid Obesity (BMI 44) Hypertension H/o hypercholesterolemia  Post-operative Diagnosis: Morbid Obesity (BMI 44) Hypertension H/o hypercholesterolemia Hiatal hernia  Surgeon: Atilano Ina   Assistants: Wenda Low, MD  Anesthesia: General endotracheal anesthesia  ASA Class: 3   Anesthesia: General plus local  Indications: Morbid Obesity unresponsive to medical treatment.  Findings: Small hiatal hernia, repaired Crus with 1 endostitch. Enlarged gastrohepatic lymph nodes along the lesser curve. No other abnormal findings  Specimen: gastrohepatic lymph node   Procedure Details  The patient was seen in the Holding Room. The risks, benefits, complications, treatment options, and expected outcomes were discussed with the patient. The possibilities of reaction to medication, pulmonary aspiration, perforation of viscus, bleeding, recurrent infection, the need for additional procedures, failure to diagnose a condition, and creating a complication requiring transfusion or operation were discussed with the patient. The patient concurred with the proposed plan, giving informed consent.   The patient was taken to Operating Room # 1, identified as Adam Adkins and the procedure verified as Laparoscopic Adjustable Gastric Band Placement and possible hiatal hernia repair. A Time Out was held and the above information confirmed.  Full general anesthesia was induced with orotracheal intubation.  The patient was prepped and draped in a supine position. Appropriate antibiotics were given intravenously.  A 1cm incision was made 2 fingerbreadths below the left subcostal margin . Opitview technique was used to gain entry to the abdominal cavity. A 5 mm blunt trocar was advanced under direct vision through the abdominal wall with a 0 degree scope. The abdomen was insufflated, the  laparoscope introduced.  There were no untoward findings on diagnostic laparoscopy. Trocars were placed under direct vision in the following fashion: a 15mm trocar in the right upper quadrant, a 5 mm trocar in the right upper quadrant, a 5mm trocar in the high epigastrium, and one 5 mm trocar slightly above and to the left of the umbilicus. The patient was placed in reverse trendelenburg position.  The [facility] liver retractor was then placed through the high epigastric trocar site and positioned to hold the liver.   There were several enlarged gastrohepatic lymph nodes but no other abnormal findings.   The calibration tube was placed into the oropharynx and advanced into the stomach. 10cc of air was placed into the calibration tube and slid back toward the hiatus. It did not meet much resistance and was able to be slid back into the esophagus. The tubing was deflated and slid into the esophagus. Using blunt graspers I identified the right crus and then the left crus. There was a small defect. I repaired it a interrupted surgidac endostitch and secured it with a titanium tie knot. The calibration tubing was then advanced back into the stomach. 10cc of air was placed into the calibration tube and the tube was slid back toward the hiatus. It met resistance at the hiatus and the air was withdrawn and the tube was slid back into the esophagus.   The angle of His was identified and the left crus was dissected free.  Approximately 8cm below the angle of His on the lesser curvature, passing through the pars flaccida and preserving the vagus nerve, a blunt instrument was gently passed anterior to the right crus and behind the gastro-esophageal junction without difficulty. Care was taken to minimize posterior dissection in order to prevent a posterior slip.   A AP-S Lap Band had been introduced into  the abdominal cavity through the 15mm trocar and carried around the gastro-esophageal junction and locked onto  itself. Three interrupted 2.0 Ethibond sutures (each secured with a titanium tie knot) were used to imbricate the anterior stomach to itself over the band to prevent anterior slippage.   The bowel was examined and there were no obvious lesions. Hemostasis was verified. The liver retractor was removed under direct visualization. There was no evidence of liver injury. The tubing from the band was brought out via the right upper quadrant 15mm trocar site. All trocars were then removed under direct vision. The skin incision was lengthened and a subcutaneous space was made to accommodate the port. A 1 inch square of vicryl mesh was anchored to the base of the port with 4 sutures. The port was attached to the tubing and then placed in the subcutaneous pocket.  The redundant tubing was advanced back into the abdominal cavity. Inverted interrupted deep dermal sutures using a 2-0 vicryl were placed.   The wounds were heavily irrigated. The skin incisions were closed with 4-0 monocryl. Benzoin, steri-strips, and bandages were applied.   Instrument, sponge, and needle counts were correct prior to wound closure and at the conclusion of the case.          Complications:  None; patient tolerated the procedure well.                Condition: stable  Adam Adkins. Andrey Campanile, MD, FACS General, Bariatric, & Minimally Invasive Surgery [facility] Surgery, [place]

## 2012-07-22 NOTE — Transfer of Care (Signed)
Immediate Anesthesia Transfer of Care Note  Patient: Adam Adkins  Procedure(s) Performed: Procedure(s) (LRB) with comments: LAPAROSCOPIC GASTRIC BANDING (N/A) HERNIA REPAIR HIATAL (N/A)  Patient Location: PACU  Anesthesia Type:General  Level of Consciousness: awake, alert , oriented and patient cooperative  Airway & Oxygen Therapy: Patient Spontanous Breathing  Post-op Assessment: Report given to PACU RN and Post -op Vital signs reviewed and stable  Post vital signs: Reviewed and stable  Complications: No apparent anesthesia complications

## 2012-07-23 ENCOUNTER — Encounter (HOSPITAL_COMMUNITY): Payer: Self-pay | Admitting: General Surgery

## 2012-08-06 ENCOUNTER — Encounter: Payer: Self-pay | Admitting: *Deleted

## 2012-08-06 ENCOUNTER — Encounter: Payer: BC Managed Care – PPO | Attending: General Surgery | Admitting: *Deleted

## 2012-08-06 VITALS — Ht 67.0 in | Wt 275.5 lb

## 2012-08-06 DIAGNOSIS — E66813 Obesity, class 3: Secondary | ICD-10-CM

## 2012-08-06 DIAGNOSIS — Z713 Dietary counseling and surveillance: Secondary | ICD-10-CM | POA: Insufficient documentation

## 2012-08-06 DIAGNOSIS — Z01818 Encounter for other preprocedural examination: Secondary | ICD-10-CM | POA: Insufficient documentation

## 2012-08-06 NOTE — Progress Notes (Signed)
Bariatric Class:  Appt start time: 1600 end time:  1700.  2 Week Post-Operative Nutrition Class  Patient was seen on 08/06/2012 for Post-Operative Nutrition education at [facility].   Surgery date: 07/22/12 Surgery type: LAGB Start weight: 313.1 lbs (05/11/12)  Weight today: 275.5 lbs Weight change: 37.6 lbs Total weight lost: 37.6 lbs BMI: 43.2  TANITA  BODY COMP RESULTS  08/06/12   Fat Mass (lbs) 156.0   Fat Free Mass (lbs) 119.5   Total Body Water (lbs) 87.5   The following the learning objectives were met by the patient during this course:   Identifies Phase 3A (Soft, High Proteins) Dietary Goals and will begin from 2 weeks post-operatively to 2 months post-operatively  Identifies appropriate sources of fluids and proteins   States protein recommendations and appropriate sources post-operatively  Identifies the need for appropriate texture modifications, mastication, and bite sizes when consuming solids  Identifies appropriate multivitamin and calcium sources post-operatively  Describes the need for physical activity post-operatively and will follow MD recommendations  States when to call healthcare provider regarding medication questions or post-operative complications  Handouts given during class include:  Phase 3A: Soft, High Protein Diet Handout  Band Fill Guidelines Handout  Follow-Up Plan: Patient will follow-up at [facility] in 6 weeks for 2 months post-op nutrition visit for diet advancement per MD.

## 2012-08-06 NOTE — Patient Instructions (Signed)
Patient to follow Phase 3A-Soft, High Protein Diet and follow-up at [facility] in 6 weeks for 2 months post-op nutrition visit for diet advancement. 

## 2012-08-07 ENCOUNTER — Encounter (INDEPENDENT_AMBULATORY_CARE_PROVIDER_SITE_OTHER): Payer: BC Managed Care – PPO | Admitting: General Surgery

## 2012-08-08 ENCOUNTER — Ambulatory Visit (INDEPENDENT_AMBULATORY_CARE_PROVIDER_SITE_OTHER): Payer: BC Managed Care – PPO | Admitting: General Surgery

## 2012-08-08 ENCOUNTER — Encounter (INDEPENDENT_AMBULATORY_CARE_PROVIDER_SITE_OTHER): Payer: Self-pay | Admitting: General Surgery

## 2012-08-08 VITALS — BP 128/80 | HR 100 | Temp 98.6°F | Resp 16 | Ht 68.0 in | Wt 275.0 lb

## 2012-08-08 DIAGNOSIS — Z09 Encounter for follow-up examination after completed treatment for conditions other than malignant neoplasm: Secondary | ICD-10-CM

## 2012-08-08 NOTE — Progress Notes (Signed)
Subjective:     Patient ID: Adam Adkins, male   DOB: Mar 16, 1971, 41 y.o.   MRN: [id number]  HPI 41 year old African American male comes in for his first postoperative appointment after undergoing laparoscopic adjustable gastric band placement and hiatal hernia repair on November 18. He states that he has done well since surgery. He was recently advanced to solid proteins by the nutritionist. He denies any fever, chills, reflux, regurgitation, or diarrhea. He states that he had a few days of constipation after surgery but now he is regular. He is no longer taking any pain medication. He is still exercising on a daily basis. He reports that he is taking his calcium and multivitamin daily. He denies any incisional problems. He denies any shortness of breath or muscle cramps. He denies any calf pain  Review of Systems     Objective:   Physical Exam BP 128/80  Pulse 100  Temp 98.6 F (37 C) (Temporal)  Resp 16  Ht 5\' 8"  (1.727 m)  Wt 275 lb (124.739 kg)  BMI 41.81 kg/m2  See LapBand flowsheet  Gen: alert, NAD, non-toxic appearing HEENT: normocephalic, atraumatic; pupils equal, no scleral icterus, neck supple, no lymphadenopathy Pulm: Lungs clear to auscultation, symmetric chest rise CV: regular rate and rhythm Abd: soft, nontender, nondistended. Well-healed trocar sites. No incisional hernia. Port is in right mid-abdomen Ext: no edema, normal, symmetric strength Neuro: nonfocal, sensation grossly intact Psych: appropriate, judgment normal     Assessment:     Status post laparoscopic adjustable gastric band placement and hiatal hernia repair on November 18    Plan:     He appears to be doing quite well. I congratulated him on his weight loss. His weight loss since surgery has been 24 pounds. His total weight loss since his first visit with me he has been 39 pounds. I encouraged  Him to continue with his daily exercise. He was instructed to continue with his daily supplements. I will  see him back in about 12 days for his first adjustment. I have released him to resume exercising in the gym.  Mary Sella. Andrey Campanile, MD, FACS General, Bariatric, & Minimally Invasive Surgery [facility] Surgery, [place]

## 2012-08-08 NOTE — Patient Instructions (Signed)
Keep up the great work!

## 2012-08-21 ENCOUNTER — Ambulatory Visit (INDEPENDENT_AMBULATORY_CARE_PROVIDER_SITE_OTHER): Payer: BC Managed Care – PPO | Admitting: General Surgery

## 2012-08-21 ENCOUNTER — Encounter (INDEPENDENT_AMBULATORY_CARE_PROVIDER_SITE_OTHER): Payer: Self-pay | Admitting: General Surgery

## 2012-08-21 VITALS — BP 136/78 | HR 92 | Temp 97.8°F | Resp 12 | Ht 68.0 in | Wt 271.4 lb

## 2012-08-21 DIAGNOSIS — Z9884 Bariatric surgery status: Secondary | ICD-10-CM | POA: Insufficient documentation

## 2012-08-21 NOTE — Patient Instructions (Addendum)
1. Stay on liquids for the next 2 days as you adapt to your new fill volume.  Then resume your previous diet. 2. Decreasing your carbohydrate intake will hasten you weight loss.  Rely more on proteins for your meals.  Avoid condiments that contain sweets such as Honey Mustard and sugary salad dressings.   3. Stay in the "green zone".  If you are regurgitating with meals, having night time reflux, and find yourself eating soft comfort foods (mashed potatoes, potato chips)...realize that you are developing "maladaptive eating".  You will not lose weight this way and may regain weight.  The GREEN ZONE is eating smaller portions and not regurgitating.  Hence we may need to withdraw fluid from your band. 4. Build exercise into your daily routine.  Walking is the best way to start but do something every day if you can.   5.  I will discuss your lymph node biopsy results with Dr Kirtland Bouchard  Sarcoidosis, Schaumann's Disease, Sarcoid of [place] Sarcoidosis appears briefly and heals naturally in 60 to 70 percent of cases, often without the patient knowing or doing anything about it. 20 to 30 percent of patients with sarcoidosis are left with some permanent lung damage. In 10 to 15 percent of the patients, sarcoidosis can become chronic (long lasting). When either the granulomas or fibrosis seriously affect the function of a vital organ (lungs, heart, nervous system, liver, or kidneys), sarcoidosis can be fatal. This occurs 5 to 10 percent of the time. No one can predict how sarcoidosis will progress in an individual patient. The symptoms the patient experiences, the caregiver's findings, and the patient's race can give some clues. Sarcoidosis was once considered a rare disease. We now know that it is a common chronic illness that appears all over the world. It is the most common of the fibrotic (scarring) lung disorders. Anyone can get sarcoidosis. It occurs in all races and in both sexes. The risk is greater if you are a young  black adult, especially a black woman, or are of [place], [place], [place], or [place] origin. In sarcoidosis, small lumps (also called nodules or granulomas) develop in multiple organs of the body. These granulomas are small collections of inflamed cells. They commonly appear in the lungs. This is the most common organ affected. They also occur in the lymph nodes (your glands), skin, liver, and eyes. The granulomas vary in the amount of disease they produce from very little with no problems (symptoms) to causing severe illness. The cause of sarcoidosis is not known. It may be due to an abnormal immune reaction in the body. Most people will recover. A few people will develop long lasting conditions that may get worse. Women are affected more often than men. The majority of those affected are under forty years of age. Because we do not know the cause, we do not have ways to prevent it. SYMPTOMS   Fever.  Loss of appetite.  Night sweats.  Joint pain.  Aching muscles Symptoms vary because the disease affects different parts of the body in different people. Most people who see their caregiver with sarcoidosis have lung problems. The first signs are usually a dry cough and shortness of breath. There may also be wheezing, chest pain, or a cough that brings up bloody mucus. In severe cases, lung function may become so poor that the person cannot perform even the simple routine tasks of daily life. Other symptoms of sarcoidosis are less common than lung symptoms. They can include:  Skin symptoms. Sarcoidosis can appear as a collection of tender, red bumps called erythema nodosum. These bumps usually occur on the face, shins, and arms. They can also occur as a scaly, purplish discoloration on the nose, cheeks, and ears. This is called lupus pernio. Less often, sarcoidosis causes cysts, pimples, or disfiguring over growths of skin. In many cases, the disfiguring over growths develop in areas of scars or  tattoos.  Eye symptoms. These include redness, eye pain, and sensitivity to light.  Heart symptoms. These include irregular heartbeat and heart failure.  Other symptoms. A person may have paralyzed facial muscles, seizures, psychiatric symptoms, swollen salivary glands, or bone pain. DIAGNOSIS  Even when there are no symptoms, your caregiver can sometimes pick up signs of sarcoidosis during a routine examination, usually through a chest x-ray or when checking other complaints. The patient's age and race or ethnic group can raise an additional red flag that a sign or symptom could be related to sarcoidosis.   Enlargement of the salivary or tear glands and cysts in bone tissue may also be caused by sarcoidosis.  You may have had a biopsy done that shows signs of sarcoidosis. A biopsy is a small tissue sample that is removed for laboratory testing. This tissue sample can be taken from your lung, skin, lip, or another inflamed or abnormal area of the body.  You may have had an abnormal chest X-ray. Although you appear healthy, a chest X-ray ordered for other reasons may turn up abnormalities that suggest sarcoidosis.  Other tests may be needed. These tests may be done to rule out other illnesses or to determine the amount of organ damage caused by sarcoidosis. Some of the most common tests are:  Blood levels of calcium or angiotensin-converting enzyme may be high in people with sarcoidosis.  Blood tests to evaluate how well your liver is functioning.  Lung function tests to measure how well you are breathing.  A complete eye examination. TREATMENT  If sarcoidosis does not cause any problems, treatment may not be necessary. Your caregiver may decide to simply monitor your condition. As part of this monitoring process, you may have frequent office visits, follow-up chest X-rays, and tests of your lung function.If you have signs of moderate or severe lung disease, your doctor may recommend:  A  corticosteroid drug, such as prednisone (sold under several brand names).  Corticosteroids also are used to treat sarcoidosis of the eyes, joints, skin, nerves, or heart.  Corticosteroid eye drops may be used for the eyes.  Over-the-counter medications like nonsteroidal anti-inflammatory drugs (NSAID) often are used to treat joint pain first before corticosteroids, which tend to have more side effects.  If corticosteroids are not effective or cause serious side effects, other drugs that alter or suppress the immune system may be used.  In rare cases, when sarcoidosis causes life-threatening lung disease, a lung transplant may be necessary. However, there is some risk that the new lungs also will be attacked by sarcoidosis. SEEK IMMEDIATE MEDICAL CARE IF:   You suffer from shortness of breath or a lingering cough.  You develop new problems that may be related to the disease. Remember this disease can affect almost all organs of the body and cause many different problems. Document Released: 06/21/2004 Document Revised: 11/13/2011 Document Reviewed: 11/29/2005 [facility] Patient Information 2013 [place], [place].

## 2012-08-21 NOTE — Progress Notes (Signed)
Subjective:     Patient ID: Adam Adkins, male   DOB: 1971-04-02, 41 y.o.   MRN: [id number]  HPI 41 year old [place] American male comes in today for followup after undergoing laparoscopic adjustable gastric band surgery on November 18. I last saw him in the office on December 5. He states that he has been doing well. He states he has no restriction. He denies any reflux or morning cough. He denies any regurgitation. He states it is very easy to swallow food. He is still exercising on a daily basis. He denies any diarrhea or constipation. He denies any cough or shortness of breath. He is taking his multivitamin.  Review of Systems     Objective:   Physical Exam BP 136/78  Pulse 92  Temp 97.8 F (36.6 C) (Temporal)  Resp 12  Ht 5\' 8"  (1.727 m)  Wt 271 lb 6.4 oz (123.106 kg)  BMI 41.27 kg/m2  See LapBand flowsheet  Gen: alert, NAD, non-toxic appearing HEENT: normocephalic, atraumatic; pupils equal, no scleral icterus, neck supple, no lymphadenopathy Pulm: Lungs clear to auscultation, symmetric chest rise CV: regular rate and rhythm Abd: soft, nontender, nondistended. Well-healed trocar sites. No incisional hernia. 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 He has lost an additional 4 pounds since his last visit. His total weight loss since surgery is 27.6 pounds. His total weight loss since his initial visit in the office is 42.6 pounds. I encouraged him to continue exercising on a regular basis. We discussed the importance of healthy choices.  After obtaining verbal consent, the abdominal wall was prepped with Chloraprep. The port was  accessed with a Huber needle and 1.5 cc of saline was added to give the patient an expected fill volume of 5.2 cc.  The patient was able to tolerate sips of water. The patient was instructed to stay on a liquid diet for at least 24 hours.  We also discussed the pathology report from surgery. During surgery, I found evidence of enlarged lymph nodes in the gastrohepatic ligament. We excise one of these lymph nodes and sent it for pathology. It came back as granulomatous lymphadenitis suggestive of sarcoidosis.there was no indication preoperatively that the patient had a potential autoimmune disease. His CBC preoperatively was normal.his preoperative chest x-ray was unremarkable. I did not see any other abnormalities within his abdomen. I do not believe this will cause a significant problem with this lap band. Nonetheless I do believe he probably needs to have additional evaluation and workup for possible sarcoid. I will discuss this with his primary care physician  Followup for 4 weeks  Mary Sella. Andrey Campanile, MD, FACS General, Bariatric, & Minimally Invasive Surgery [facility], [place]

## 2012-08-23 ENCOUNTER — Telehealth (INDEPENDENT_AMBULATORY_CARE_PROVIDER_SITE_OTHER): Payer: Self-pay | Admitting: General Surgery

## 2012-08-23 NOTE — Telephone Encounter (Signed)
Spoke with pt's PCP, Dr Kirtland Bouchard regarding lymph node bx showing granulomatosis lymphadenitis suggestive of sarcoid. He said they would assume addl work-up for possible sarcoid

## 2012-09-11 ENCOUNTER — Encounter: Payer: Self-pay | Admitting: *Deleted

## 2012-09-11 ENCOUNTER — Encounter: Payer: BC Managed Care – PPO | Attending: General Surgery | Admitting: *Deleted

## 2012-09-11 VITALS — Ht 68.0 in | Wt 263.0 lb

## 2012-09-11 DIAGNOSIS — Z713 Dietary counseling and surveillance: Secondary | ICD-10-CM | POA: Insufficient documentation

## 2012-09-11 DIAGNOSIS — Z01818 Encounter for other preprocedural examination: Secondary | ICD-10-CM | POA: Insufficient documentation

## 2012-09-11 NOTE — Patient Instructions (Addendum)
Goals:  Follow Phase 3B: High Protein + Non-Starchy Vegetables  Eat 3-6 small meals/snacks, every 3-5 hrs  Increase lean protein foods to meet 80g goal  Increase fluid intake to 64oz +  Avoid drinking 15 minutes before, during and 30 minutes after eating  Aim for >30 min of physical activity daily  Try PB2 ([employer] or get for $5.99 at [employer] in [place] (owner is [employer])

## 2012-09-11 NOTE — Progress Notes (Signed)
Follow-up visit:  8 Weeks Post-Operative LAGB Surgery  Medical Nutrition Therapy:  Appt start time: 1630 end time:  1700.  Primary concerns today: Post-operative Bariatric Surgery Nutrition Management. Torrin returns today for 2 month f/u with a 37 lb loss of FAT MASS. Holding onto 18 lbs of fluid from last visit; likely d/t stopping lisinopril-HCTZ because of hypotension. Doing extremely well. Following all post-op guidelines.    Surgery date: 07/22/12 Surgery type: LAGB Start weight: 313.1 lbs (05/11/12)  Weight today: 263.0 lbs Weight change: 12.5 lbs Total weight lost: 50.1 lbs  TANITA  BODY COMP RESULTS  08/06/12 09/11/12   BMI (kg/m^2) 43.2 40.0   Fat Mass (lbs) 156.0 119.0   Fat Free Mass (lbs) 119.5 144.0   Total Body Water (lbs) 87.5 105.5   24-hr recall: B (AM): Protein shake w/ almond milk (whey pwdr from [facility]) OR Atkins shake - 15-30g Snk (AM):  NONE L (PM): 3 oz grilled chicken w/ LF mayo and onions Snk (PM): NONE   D (PM): Same as lunch sometimes grilled salmon/tilapia Snk (PM): NONE  Fluid intake: > 64 oz Estimated total protein intake: 80+ g  Medications: Stopped taking lisinopril-HCTZ d/t hypotension/dizziness. Holding onto additional 18 lbs of fluid since last visit (08/06/12). Advised to contact PCP.  Supplementation: Taking regularly  Using straws: No Drinking while eating: No Hair loss: No Carbonated beverages: No N/V/D/C: No Last Lap-Band fill:  08/21/12; Feels in the green/yellow zone.  Recent physical activity:  40-45 min treadmill daily; wt training 30-45 min daily  Progress Towards Goal(s):  In progress.  Handouts given during visit include:  Phase 3B: High Protein & Non-Starchy Vegetables   Nutritional Diagnosis:  [place]-3.3 Overweight/obesity related to past poor dietary habits and physical inactivity as evidenced by patient w/ recent LAGB surgery following dietary guidelines for continued weight loss.     Intervention:  Nutrition education/diet  advancement.  Monitoring/Evaluation:  Dietary intake, exercise, lap band fills, and body weight. Follow up in 1 month for 3 month post-op visit.

## 2012-09-18 ENCOUNTER — Encounter (INDEPENDENT_AMBULATORY_CARE_PROVIDER_SITE_OTHER): Payer: BC Managed Care – PPO | Admitting: General Surgery

## 2012-10-04 ENCOUNTER — Other Ambulatory Visit: Payer: Self-pay | Admitting: Internal Medicine

## 2012-10-09 ENCOUNTER — Ambulatory Visit: Payer: BC Managed Care – PPO | Admitting: *Deleted

## 2012-10-19 ENCOUNTER — Other Ambulatory Visit: Payer: Self-pay

## 2012-10-24 ENCOUNTER — Encounter (INDEPENDENT_AMBULATORY_CARE_PROVIDER_SITE_OTHER): Payer: Self-pay | Admitting: General Surgery

## 2012-10-24 ENCOUNTER — Ambulatory Visit (INDEPENDENT_AMBULATORY_CARE_PROVIDER_SITE_OTHER): Payer: BC Managed Care – PPO | Admitting: General Surgery

## 2012-10-24 VITALS — BP 112/68 | HR 84 | Temp 97.6°F | Resp 18 | Ht 68.0 in | Wt 248.0 lb

## 2012-10-24 DIAGNOSIS — Z09 Encounter for follow-up examination after completed treatment for conditions other than malignant neoplasm: Secondary | ICD-10-CM

## 2012-10-24 NOTE — Patient Instructions (Signed)
Keep up the great work!!!! Follow up with your primary care doctor to discuss BP pill

## 2012-10-24 NOTE — Progress Notes (Signed)
Subjective:     Patient ID: Adam Adkins, male   DOB: [birth year], 42 y.o.   MRN: [id number]  HPI 42 yo AAM s/p LAGB on 07/22/12 comes in for f/u. Last seen on 08/21/12. Reports he is doing great. Exercising 7 days a week. Doing interval training on treadmill. Also doing some weight lifting. Denies abd pain,n/v/d/c/f/c/reflux/cough. Did have 1-2 episodes of lightheadedness and was told by PCP to hold BP med on those days. Reports good food choices. BMs now regular since eating about 14 almonds/day. Reports problems with solid chicken - just stuck despite chewing thoroughly. If chicken has a little dressing it - it will go down. Feels adequate restriction.   Review of Systems     Objective:   Physical Exam BP 112/68  Pulse 84  Temp(Src) 97.6 F (36.4 C) (Temporal)  Resp 18  Ht 5\' 8"  (1.727 m)  Wt 248 lb (112.492 kg)  BMI 37.72 kg/m2  See LapBand flowsheet  Gen: alert, NAD, non-toxic appearing HEENT: normocephalic, atraumatic; pupils equal, no scleral icterus, neck supple, no lymphadenopathy Pulm: Lungs clear to auscultation, symmetric chest rise CV: regular rate and rhythm Abd: soft, nontender, nondistended. Well-healed trocar sites. No incisional hernia. Port is in right mid-abdomen Ext: no edema, normal, symmetric strength Neuro: nonfocal, sensation grossly intact Psych: appropriate, judgment normal     Assessment:     S/p LAGB Obesity BMI 37 HTN - improved HPL - stable     Plan:     He is doing great!! Total weight loss since initial visit has been 66 pounds!! Wt loss since immediate preop appt has been 51 pounds. Encouraged to cont with daily exercise. Since has adequate restriction will defer band adjustment. Encouraged to f/u with PCP to look at BP med dosage.  Since doing so well f/u with me in 8 weeks  Mary Sella. Andrey Campanile, MD, FACS General, Bariatric, & Minimally Invasive Surgery [facility], [place]

## 2012-10-31 ENCOUNTER — Ambulatory Visit: Payer: BC Managed Care – PPO | Admitting: *Deleted

## 2012-11-04 ENCOUNTER — Ambulatory Visit (INDEPENDENT_AMBULATORY_CARE_PROVIDER_SITE_OTHER): Payer: BC Managed Care – PPO | Admitting: Internal Medicine

## 2012-11-04 ENCOUNTER — Encounter: Payer: Self-pay | Admitting: Internal Medicine

## 2012-11-04 VITALS — BP 110/68 | HR 78 | Temp 98.5°F | Resp 18 | Wt 247.0 lb

## 2012-11-04 DIAGNOSIS — I10 Essential (primary) hypertension: Secondary | ICD-10-CM

## 2012-11-04 DIAGNOSIS — E7800 Pure hypercholesterolemia, unspecified: Secondary | ICD-10-CM

## 2012-11-04 NOTE — Patient Instructions (Addendum)
Limit your sodium (Salt) intake  Please check your blood pressure on a regular basis.  If it is consistently greater than 140/90, please make an office appointment.  Discontinued simvastatin in 6 months and return office visit in 7 months

## 2012-11-04 NOTE — Progress Notes (Signed)
Subjective:    Patient ID: Adam Adkins, male    DOB: [birth year], 42 y.o.   MRN: [id number]  HPI  42 year old patient who has a history of hypertension and dyslipidemia who is now status post bariatric surgery. He has lost approximately 70 pounds in weight since his last visit here. He has had occasional dizziness and has at times places blood pressure medication on hold. He has allergic rhinitis and does take Claritin-D on a regular basis. He also remains on simvastatin for dyslipidemia. He feels quite well and is exercising daily.  Past Medical History  Diagnosis Date  . ERECTILE DYSFUNCTION 08/21/2008  . HYPERTENSION 05/10/2007  . Pure hypercholesterolemia 04/24/2008  . Morbid obesity     History   Social History  . Marital Status: Married    Spouse Name: N/A    Number of Children: N/A  . Years of Education: N/A   Occupational History  . Not on file.   Social History Main Topics  . Smoking status: Former Smoker -- 0.30 packs/day    Types: Cigarettes    Quit date: 09/04/1998  . Smokeless tobacco: Never Used  . Alcohol Use: No  . Drug Use: No  . Sexually Active: Yes   Other Topics Concern  . Not on file   Social History Narrative  . No narrative on file    Past Surgical History  Procedure Laterality Date  . Wisdom tooth extraction    . Laparoscopic gastric banding  07/22/2012    Procedure: LAPAROSCOPIC GASTRIC BANDING;  Surgeon: Atilano Ina, MD,FACS;  Location: [facility];  Service: General;  Laterality: N/A;  . Hiatal hernia repair  07/22/2012    Procedure: HERNIA REPAIR HIATAL;  Surgeon: Atilano Ina, MD,FACS;  Location: [facility];  Service: General;  Laterality: N/A;    Family History  Problem Relation Age of Onset  . Breast cancer Maternal Grandmother   . Prostate cancer Paternal Grandfather     No Known Allergies  Current Outpatient Prescriptions on File Prior to Visit  Medication Sig Dispense Refill  . fluticasone (FLONASE) 50 MCG/ACT nasal spray USE ONE  SPRAY IN EACH NOSTRIL EVERY DAY  16 g  1  . lisinopril-hydrochlorothiazide (PRINZIDE,ZESTORETIC) 20-25 MG per tablet Take 1 tablet by mouth daily.  90 tablet  3  . loratadine (CLARITIN) 10 MG tablet Take 10 mg by mouth daily.        . sildenafil (VIAGRA) 100 MG tablet Take 100 mg by mouth daily as needed.        . simvastatin (ZOCOR) 40 MG tablet Take 1 tablet (40 mg total) by mouth at bedtime.  90 tablet  3  . tadalafil (CIALIS) 20 MG tablet Take 20 mg by mouth daily as needed.        . vardenafil (LEVITRA) 20 MG tablet Take 1 tablet (20 mg total) by mouth as needed for erectile dysfunction.  12 tablet  4   No current facility-administered medications on file prior to visit.    BP 110/68  Pulse 78  Temp(Src) 98.5 F (36.9 C) (Oral)  Resp 18  Wt 247 lb (112.038 kg)  BMI 37.56 kg/m2  SpO2 97%       Review of Systems  Constitutional: Negative for fever, chills, appetite change and fatigue.  HENT: Negative for hearing loss, ear pain, congestion, sore throat, trouble swallowing, neck stiffness, dental problem, voice change and tinnitus.   Eyes: Negative for pain, discharge and visual disturbance.  Respiratory: Negative for cough, chest  tightness, wheezing and stridor.   Cardiovascular: Negative for chest pain, palpitations and leg swelling.  Gastrointestinal: Negative for nausea, vomiting, abdominal pain, diarrhea, constipation, blood in stool and abdominal distention.  Genitourinary: Negative for urgency, hematuria, flank pain, discharge, difficulty urinating and genital sores.  Musculoskeletal: Negative for myalgias, back pain, joint swelling, arthralgias and gait problem.  Skin: Negative for rash.  Neurological: Negative for dizziness, syncope, speech difficulty, weakness, numbness and headaches.  Hematological: Negative for adenopathy. Does not bruise/bleed easily.  Psychiatric/Behavioral: Negative for behavioral problems and dysphoric mood. The patient is not nervous/anxious.         Objective:   Physical Exam  Constitutional: He is oriented to person, place, and time. He appears well-developed.  Weight 247 Blood pressure 100/60  HENT:  Head: Normocephalic.  Right Ear: External ear normal.  Left Ear: External ear normal.  Eyes: Conjunctivae and EOM are normal.  Neck: Normal range of motion.  Cardiovascular: Normal rate and normal heart sounds.   Pulmonary/Chest: Breath sounds normal.  Abdominal: Bowel sounds are normal.  Musculoskeletal: Normal range of motion. He exhibits no edema and no tenderness.  Neurological: He is alert and oriented to person, place, and time.  Psychiatric: He has a normal mood and affect. His behavior is normal.          Assessment & Plan:   Hypertension. We'll hold blood pressure medications at this time and continue efforts at weight loss and exercise. Will maintain a low salt diet. Home blood pressure monitoring encouraged. We'll reassess in 7 months. Call if blood pressures consistently greater than 140/90 Dyslipidemia we'll discontinue statin therapy in 6 months and repeat lipid profile in 7 months Exogenous obesity. Nice  improvement with bariatric surgery and lifestyle changes

## 2012-12-12 ENCOUNTER — Encounter (INDEPENDENT_AMBULATORY_CARE_PROVIDER_SITE_OTHER): Payer: BC Managed Care – PPO | Admitting: General Surgery

## 2013-01-08 ENCOUNTER — Encounter (INDEPENDENT_AMBULATORY_CARE_PROVIDER_SITE_OTHER): Payer: BC Managed Care – PPO | Admitting: General Surgery

## 2013-02-06 ENCOUNTER — Encounter (INDEPENDENT_AMBULATORY_CARE_PROVIDER_SITE_OTHER): Payer: BC Managed Care – PPO | Admitting: General Surgery

## 2013-03-14 ENCOUNTER — Encounter (INDEPENDENT_AMBULATORY_CARE_PROVIDER_SITE_OTHER): Payer: BC Managed Care – PPO | Admitting: General Surgery

## 2013-04-02 ENCOUNTER — Encounter (INDEPENDENT_AMBULATORY_CARE_PROVIDER_SITE_OTHER): Payer: BC Managed Care – PPO | Admitting: General Surgery

## 2013-04-04 ENCOUNTER — Telehealth (INDEPENDENT_AMBULATORY_CARE_PROVIDER_SITE_OTHER): Payer: Self-pay | Admitting: General Surgery

## 2013-04-04 NOTE — Telephone Encounter (Signed)
Called to touch base with patient. Patient has rescheduled numerous postoperative appointments. Last seen in the office in February. Patient states that he is doing well. He has had satisfactory weight loss. He denied any reflux or regurgitation. He denies any nighttime cough. He states that his wife had had a TIA and that it required a lot of time. I encouraged him to make a followup appointment with me on the PA to make sure that he was doing well with respect to eating techniques. I told him I would also send him some information through my chart

## 2013-06-06 ENCOUNTER — Ambulatory Visit: Payer: BC Managed Care – PPO | Admitting: Internal Medicine

## 2013-07-10 ENCOUNTER — Other Ambulatory Visit: Payer: Self-pay

## 2013-10-07 ENCOUNTER — Ambulatory Visit (INDEPENDENT_AMBULATORY_CARE_PROVIDER_SITE_OTHER): Payer: BC Managed Care – PPO | Admitting: Internal Medicine

## 2013-10-07 ENCOUNTER — Encounter: Payer: Self-pay | Admitting: Internal Medicine

## 2013-10-07 VITALS — BP 130/86 | HR 88 | Temp 98.6°F | Resp 20 | Ht 68.0 in | Wt 248.0 lb

## 2013-10-07 DIAGNOSIS — I10 Essential (primary) hypertension: Secondary | ICD-10-CM

## 2013-10-07 DIAGNOSIS — Z23 Encounter for immunization: Secondary | ICD-10-CM

## 2013-10-07 DIAGNOSIS — Z9884 Bariatric surgery status: Secondary | ICD-10-CM

## 2013-10-07 NOTE — Progress Notes (Signed)
Pre-visit discussion using our clinic review tool. No additional management support is needed unless otherwise documented below in the visit note.  

## 2013-10-07 NOTE — Progress Notes (Signed)
Subjective:    Patient ID: Adam Adkins, male    DOB: 06/21/1971, 43 y.o.   MRN: [id number]  HPI BP Readings from Last 3 Encounters:  10/07/13 130/86  11/04/12 110/68  10/24/12 112/68   Wt Readings from Last 3 Encounters:  10/07/13 248 lb (112.492 kg)  11/04/12 247 lb (112.038 kg)  10/24/12 248 lb (112.25492 kg)   43 year old patient who has a history of obesity who is status post gastric banding. He has a history of hypertension but presently has been off medications. He has been under situational stress due to the help of his wife in blood pressure readings are occasionally in a high normal range but never elevated. Seen for his annual assessment. Otherwise doing quite well. Weight has been basically unchanged for the past year  Past Medical History  Diagnosis Date  . ERECTILE DYSFUNCTION 08/21/2008  . HYPERTENSION 05/10/2007  . Pure hypercholesterolemia 04/24/2008  . Morbid obesity     History   Social History  . Marital Status: Married    Spouse Name: N/A    Number of Children: N/A  . Years of Education: N/A   Occupational History  . Not on file.   Social History Main Topics  . Smoking status: Former Smoker -- 0.30 packs/day    Types: Cigarettes    Quit date: 09/04/1998  . Smokeless tobacco: Never Used  . Alcohol Use: No  . Drug Use: No  . Sexual Activity: Yes   Other Topics Concern  . Not on file   Social History Narrative  . No narrative on file    Past Surgical History  Procedure Laterality Date  . Wisdom tooth extraction    . Laparoscopic gastric banding  07/22/2012    Procedure: LAPAROSCOPIC GASTRIC BANDING;  Surgeon: Atilano InaEric M Wilson, MD,FACS;  Location: [facility];  Service: General;  Laterality: N/A;  . Hiatal hernia repair  07/22/2012    Procedure: HERNIA REPAIR HIATAL;  Surgeon: Atilano InaEric M Wilson, MD,FACS;  Location: [facility];  Service: General;  Laterality: N/A;    Family History  Problem Relation Age of Onset  . Breast cancer Maternal Grandmother   .  Prostate cancer Paternal Grandfather     No Known Allergies  Current Outpatient Prescriptions on File Prior to Visit  Medication Sig Dispense Refill  . fluticasone (FLONASE) 50 MCG/ACT nasal spray USE ONE SPRAY IN EACH NOSTRIL EVERY DAY  16 g  1  . loratadine (CLARITIN) 10 MG tablet Take 10 mg by mouth daily.        . simvastatin (ZOCOR) 40 MG tablet Take 1 tablet (40 mg total) by mouth at bedtime.  90 tablet  3   No current facility-administered medications on file prior to visit.    BP 130/86  Pulse 88  Temp(Src) 98.6 F (37 C) (Oral)  Resp 20  Ht 5\' 8"  (1.727 m)  Wt 248 lb (112.492 kg)  BMI 37.72 kg/m2  SpO2 98%    Review of Systems  Constitutional: Negative for fever, chills, appetite change and fatigue.  HENT: Negative for congestion, dental problem, ear pain, hearing loss, sore throat, tinnitus, trouble swallowing and voice change.   Eyes: Negative for pain, discharge and visual disturbance.  Respiratory: Negative for cough, chest tightness, wheezing and stridor.   Cardiovascular: Negative for chest pain, palpitations and leg swelling.  Gastrointestinal: Negative for nausea, vomiting, abdominal pain, diarrhea, constipation, blood in stool and abdominal distention.  Genitourinary: Negative for urgency, hematuria, flank pain, discharge, difficulty urinating and  genital sores.  Musculoskeletal: Negative for arthralgias, back pain, gait problem, joint swelling, myalgias and neck stiffness.  Skin: Negative for rash.  Neurological: Negative for dizziness, syncope, speech difficulty, weakness, numbness and headaches.  Hematological: Negative for adenopathy. Does not bruise/bleed easily.  Psychiatric/Behavioral: Negative for behavioral problems and dysphoric mood. The patient is not nervous/anxious.        Objective:   Physical Exam  Constitutional: He is oriented to person, place, and time. He appears well-developed.  Blood pressure 124/84 Weight 248  HENT:  Head:  Normocephalic.  Right Ear: External ear normal.  Left Ear: External ear normal.  Eyes: Conjunctivae and EOM are normal.  Neck: Normal range of motion.  Cardiovascular: Normal rate and normal heart sounds.   Pulmonary/Chest: Breath sounds normal.  Abdominal: Bowel sounds are normal.  Musculoskeletal: Normal range of motion. He exhibits no edema and no tenderness.  Neurological: He is alert and oriented to person, place, and time.  Psychiatric: He has a normal mood and affect. His behavior is normal.          Assessment & Plan:   Obesity History of hypertension. Remains normotensive off medication. Low salt diet weight loss exercise all encouraged  CPX 6 months

## 2013-10-07 NOTE — Patient Instructions (Signed)

## 2013-10-08 ENCOUNTER — Telehealth: Payer: Self-pay | Admitting: Internal Medicine

## 2013-10-08 NOTE — Telephone Encounter (Signed)
Relevant patient education assigned to patient using Emmi. ° °

## 2013-12-16 ENCOUNTER — Ambulatory Visit (INDEPENDENT_AMBULATORY_CARE_PROVIDER_SITE_OTHER): Payer: BC Managed Care – PPO | Admitting: Internal Medicine

## 2013-12-16 ENCOUNTER — Encounter: Payer: Self-pay | Admitting: Internal Medicine

## 2013-12-16 VITALS — BP 150/90 | HR 104 | Temp 98.5°F | Resp 20 | Ht 68.0 in | Wt 246.0 lb

## 2013-12-16 DIAGNOSIS — R079 Chest pain, unspecified: Secondary | ICD-10-CM

## 2013-12-16 DIAGNOSIS — Z9884 Bariatric surgery status: Secondary | ICD-10-CM

## 2013-12-16 DIAGNOSIS — I10 Essential (primary) hypertension: Secondary | ICD-10-CM

## 2013-12-16 NOTE — Progress Notes (Signed)
Pre-visit discussion using our clinic review tool. No additional management support is needed unless otherwise documented below in the visit note.  

## 2013-12-16 NOTE — Progress Notes (Signed)
Subjective:    Patient ID: Adam Adkins, male    DOB: 03/17/1971, 43 y.o.   MRN: [id number]  HPI  [age] year old patient who has a history of obesity and is status post gastric lap band surgery.  Approximately 2 weeks ago while exercising at the gym.  He experienced anterior chest wall pain with weight training.  He has been using Tylenol with slow clinical improvement.  Pain is aggravated by movement and alleviated by rest.  Initially, anterior chest wall was tender to palpation, but this has improved.  Past Medical History  Diagnosis Date  . ERECTILE DYSFUNCTION 08/21/2008  . HYPERTENSION 05/10/2007  . Pure hypercholesterolemia 04/24/2008  . Morbid obesity     History   Social History  . Marital Status: Married    Spouse Name: N/A    Number of Children: N/A  . Years of Education: N/A   Occupational History  . Not on file.   Social History Main Topics  . Smoking status: Former Smoker -- 0.30 packs/day    Types: Cigarettes    Quit date: 09/04/1998  . Smokeless tobacco: Never Used  . Alcohol Use: No  . Drug Use: No  . Sexual Activity: Yes   Other Topics Concern  . Not on file   Social History Narrative  . No narrative on file    Past Surgical History  Procedure Laterality Date  . Wisdom tooth extraction    . Laparoscopic gastric banding  07/22/2012    Procedure: LAPAROSCOPIC GASTRIC BANDING;  Surgeon: Atilano InaEric M Wilson, MD,FACS;  Location: [facility];  Service: General;  Laterality: N/A;  . Hiatal hernia repair  07/22/2012    Procedure: HERNIA REPAIR HIATAL;  Surgeon: Atilano InaEric M Wilson, MD,FACS;  Location: [facility];  Service: General;  Laterality: N/A;    Family History  Problem Relation Age of Onset  . Breast cancer Maternal Grandmother   . Prostate cancer Paternal Grandfather     No Known Allergies  Current Outpatient Prescriptions on File Prior to Visit  Medication Sig Dispense Refill  . fluticasone (FLONASE) 50 MCG/ACT nasal spray USE ONE SPRAY IN EACH NOSTRIL EVERY DAY   16 g  1  . loratadine (CLARITIN) 10 MG tablet Take 10 mg by mouth daily.        . simvastatin (ZOCOR) 40 MG tablet Take 1 tablet (40 mg total) by mouth at bedtime.  90 tablet  3   No current facility-administered medications on file prior to visit.    BP 150/90  Pulse 104  Temp(Src) 98.5 F (36.9 C) (Oral)  Resp 20  Ht 5\' 8"  (1.727 m)  Wt 246 lb (111.585 kg)  BMI 37.41 kg/m2  SpO2 99%       Review of Systems  Constitutional: Negative for fever, chills, appetite change and fatigue.  HENT: Negative for congestion, dental problem, ear pain, hearing loss, sore throat, tinnitus, trouble swallowing and voice change.   Eyes: Negative for pain, discharge and visual disturbance.  Respiratory: Negative for cough, chest tightness, wheezing and stridor.   Cardiovascular: Positive for chest pain. Negative for palpitations and leg swelling.  Gastrointestinal: Negative for nausea, vomiting, abdominal pain, diarrhea, constipation, blood in stool and abdominal distention.  Genitourinary: Negative for urgency, hematuria, flank pain, discharge, difficulty urinating and genital sores.  Musculoskeletal: Negative for arthralgias, back pain, gait problem, joint swelling, myalgias and neck stiffness.  Skin: Negative for rash.  Neurological: Negative for dizziness, syncope, speech difficulty, weakness, numbness and headaches.  Hematological: Negative for adenopathy.  Does not bruise/bleed easily.  Psychiatric/Behavioral: Negative for behavioral problems and dysphoric mood. The patient is not nervous/anxious.        Objective:   Physical Exam  Constitutional: He is oriented to person, place, and time. He appears well-developed.  HENT:  Head: Normocephalic.  Right Ear: External ear normal.  Left Ear: External ear normal.  Eyes: Conjunctivae and EOM are normal.  Neck: Normal range of motion.  Cardiovascular: Normal rate, regular rhythm and normal heart sounds.   Pulmonary/Chest: Breath sounds  normal. He exhibits no tenderness.  Abdominal: Bowel sounds are normal.  Musculoskeletal: Normal range of motion. He exhibits no edema and no tenderness.  Neurological: He is alert and oriented to person, place, and time.  Psychiatric: He has a normal mood and affect. His behavior is normal.          Assessment & Plan:   Anterior chest wall pain.  Suspect musculoligamentous.  Pain is improving.  We'll continue symptomatic therapy and observe.  Will call if he clinically worsens  CPX as scheduled in the fall

## 2014-03-23 ENCOUNTER — Other Ambulatory Visit: Payer: Self-pay | Admitting: Internal Medicine

## 2014-04-10 ENCOUNTER — Encounter: Payer: BC Managed Care – PPO | Admitting: Internal Medicine

## 2014-05-28 ENCOUNTER — Encounter: Payer: BC Managed Care – PPO | Admitting: Internal Medicine

## 2014-08-10 IMAGING — CR DG UGI W/ KUB
12 series · 12 of 12 positions shown · non-contrast
Comparison: none

CLINICAL DATA: Morbid obesity.  Pre-op evaluation for bariatric
surgery.

UPPER GI SERIES W/ KUB
TECHNIQUE: After obtaining a scout radiograph a single-column
upper GI series was performed using thin barium.
Fluoroscopy time: 1.6 minutes

[run (1 of 10)]
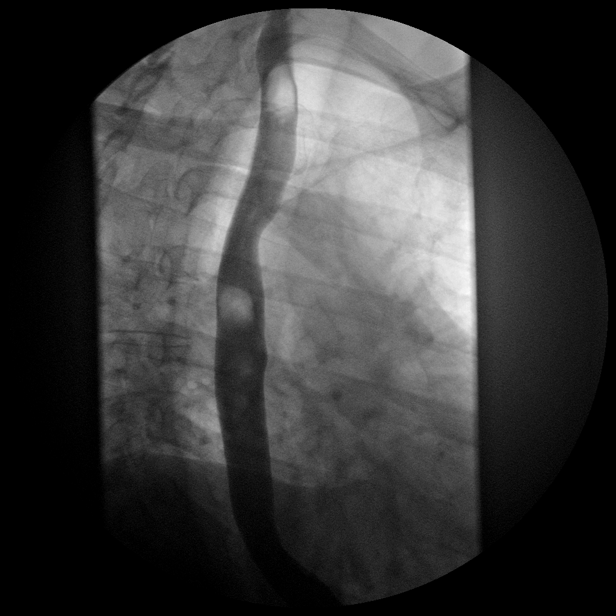

[run (2 of 10)]
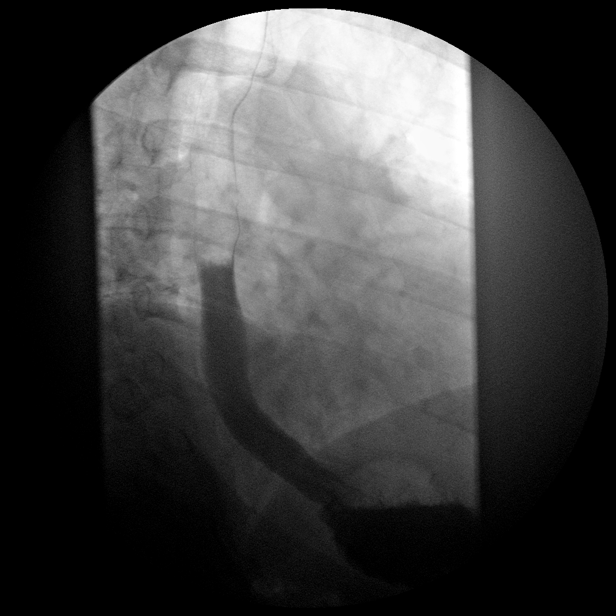

[run (3 of 10)]
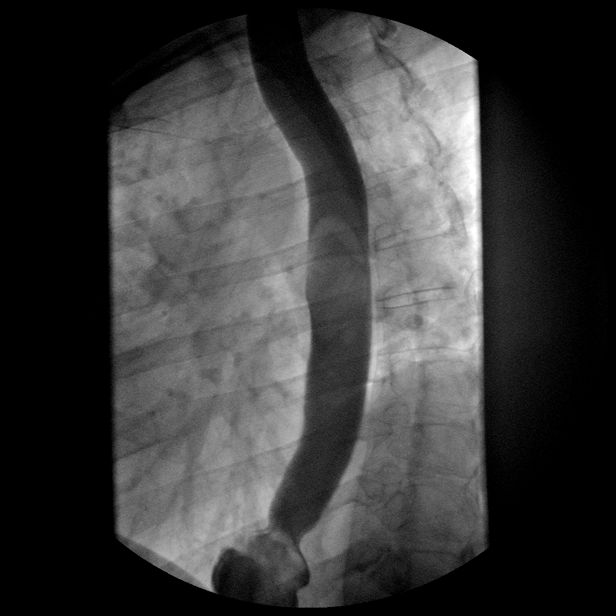

[run (4 of 10)]
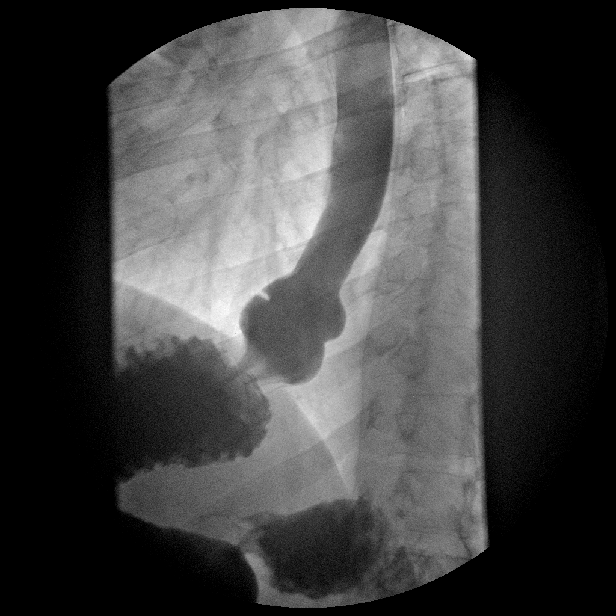

[run (5 of 10)]
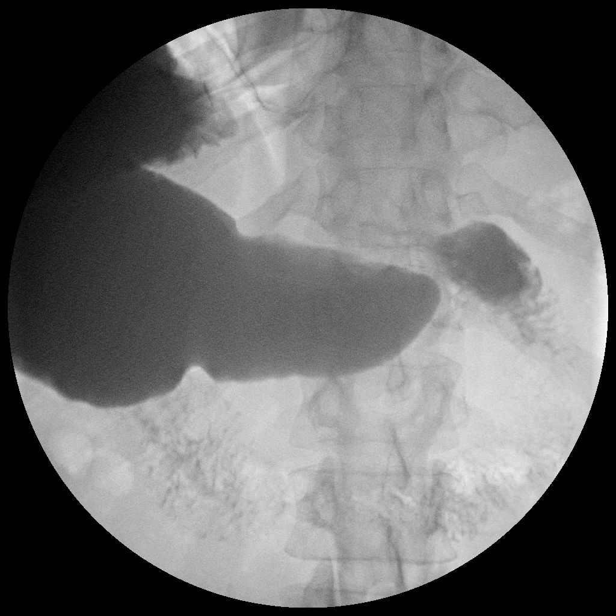

[run (6 of 10)]
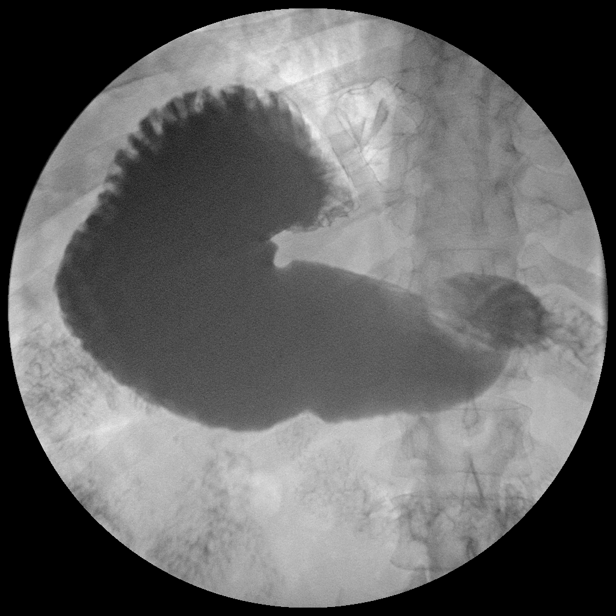

[run (7 of 10)]
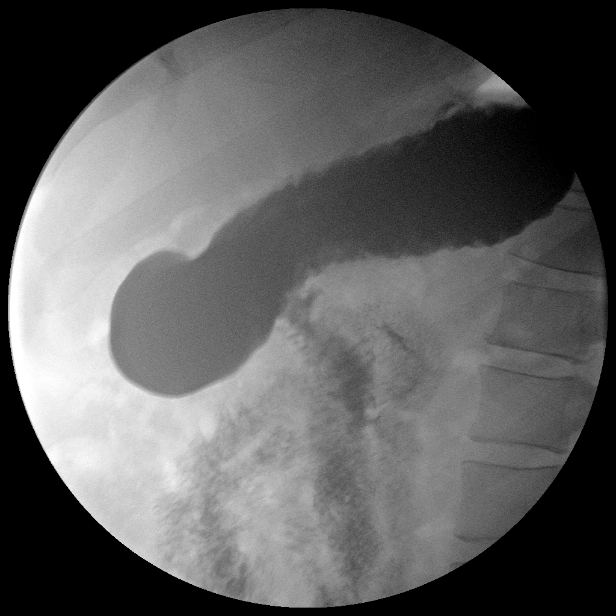

[run (8 of 10)]
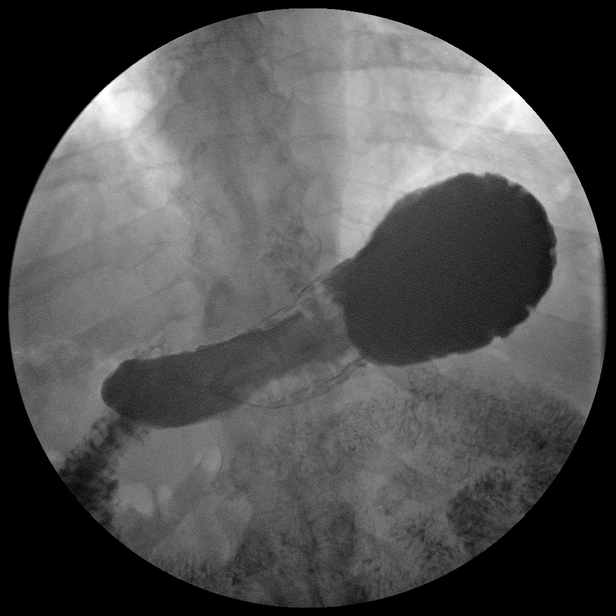

[run (9 of 10)]
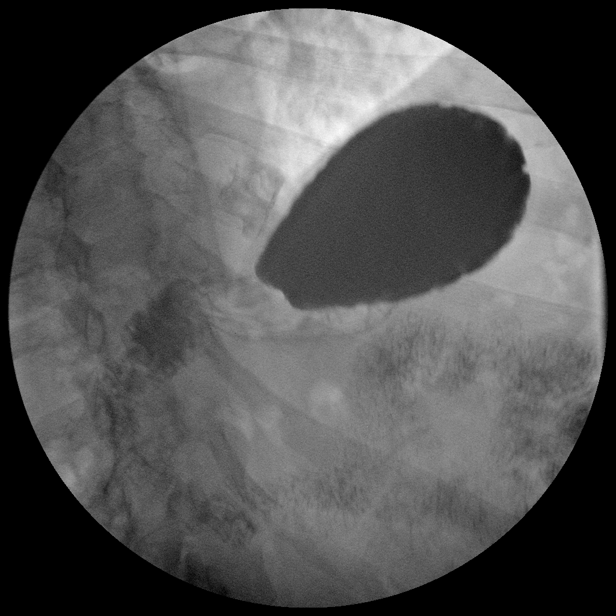

[run (10 of 10)]
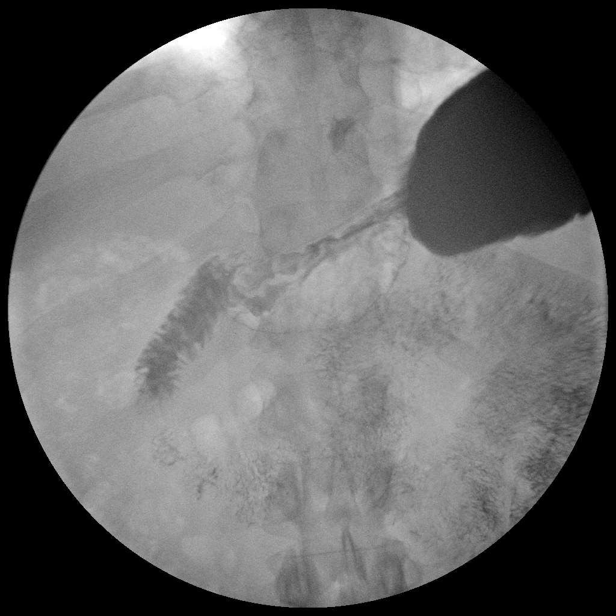

[view not recorded (1 of 2)]
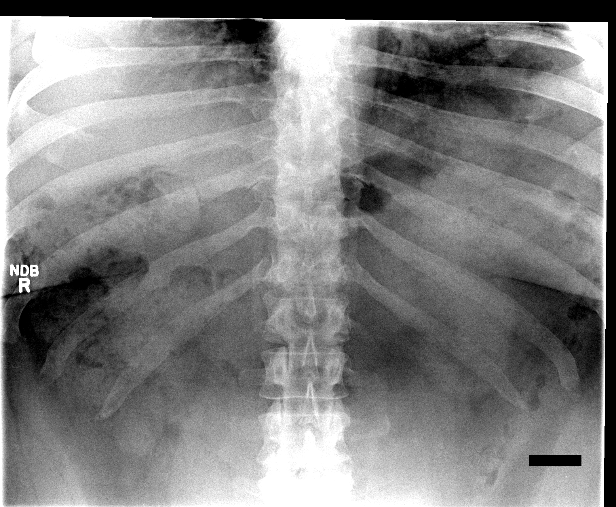

[view not recorded (2 of 2)]
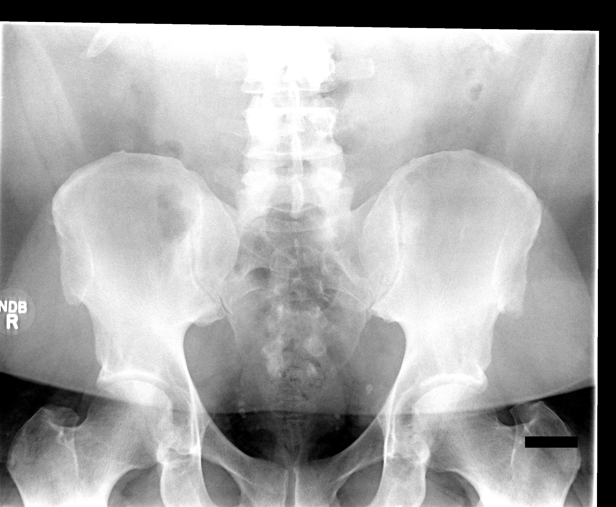

[12 of 12 positions shown; findings below may reference images not displayed]

FINDINGS: The scout radiograph shows a normal bowel gas pattern.

A small sliding hiatal hernia is seen.  There is no evidence of
esophageal mass or stricture.  No gastroesophageal reflux was seen
during the exam.  Esophageal motility is within normal limits.

The stomach is otherwise normal in appearance.  There is no
evidence of gastric masses or ulcers.  Duodenal bulb and sweep are
normal in appearance.
IMPRESSION: Small sliding hiatal hernia.  No evidence of esophageal stricture
or other significant abnormality.

## 2014-12-08 ENCOUNTER — Other Ambulatory Visit: Payer: Self-pay | Admitting: Internal Medicine

## 2015-01-10 ENCOUNTER — Encounter (HOSPITAL_COMMUNITY): Payer: Self-pay | Admitting: *Deleted

## 2015-01-10 ENCOUNTER — Emergency Department (INDEPENDENT_AMBULATORY_CARE_PROVIDER_SITE_OTHER)
Admission: EM | Admit: 2015-01-10 | Discharge: 2015-01-10 | Disposition: A | Discharge status: Home / Self Care | Payer: BC Managed Care – PPO | Source: Home / Self Care | Attending: Family Medicine | Admitting: Family Medicine

## 2015-01-10 DIAGNOSIS — M5416 Radiculopathy, lumbar region: Secondary | ICD-10-CM

## 2015-01-10 DIAGNOSIS — M5417 Radiculopathy, lumbosacral region: Secondary | ICD-10-CM

## 2015-01-10 MED ORDER — METHYLPREDNISOLONE ACETATE 80 MG/ML IJ SUSP
INTRAMUSCULAR | Status: AC
Start: 2015-01-10 — End: 2015-01-10
  Filled 2015-01-10: qty 1

## 2015-01-10 MED ORDER — METHYLPREDNISOLONE ACETATE 80 MG/ML IJ SUSP
80.0000 mg | Freq: Once | INTRAMUSCULAR | Status: AC
Start: 2015-01-10 — End: 2015-01-10
  Administered 2015-01-10: 80 mg via INTRAMUSCULAR

## 2015-01-10 MED ORDER — METHYLPREDNISOLONE ACETATE 40 MG/ML IJ SUSP: 80.0000 mg | Freq: Once | INTRAMUSCULAR | Status: DC

## 2015-01-10 MED ORDER — METHYLPREDNISOLONE 4 MG PO TBPK: ORAL_TABLET | ORAL | Status: DC

## 2015-01-10 NOTE — Discharge Instructions (Signed)
Take all of medicine, see orthopedist if further problems.

## 2015-01-10 NOTE — ED Provider Notes (Signed)
CSN: [id number]     Arrival date & time 01/10/15  0902 History   First MD Initiated Contact with Patient 01/10/15 [phone]     Chief Complaint  Patient presents with  . Back Pain   (Consider location/radiation/quality/duration/timing/severity/associated sxs/prior Treatment) Patient is a 44 y.o. male presenting with back pain. The history is provided by the patient.  Back Pain Location:  Lumbar spine Quality:  Shooting and stiffness Radiates to:  L posterior upper leg Pain severity:  Moderate Duration:  3 weeks Timing:  Intermittent Progression:  Waxing and waning Context comment:  Trying to get grandson in and out of carseat felt pain in low back., continues Ineffective treatments:  Ibuprofen Associated symptoms: leg pain and tingling   Associated symptoms: no abdominal pain, no bladder incontinence, no bowel incontinence, no fever, no numbness, no paresthesias and no weakness     Past Medical History  Diagnosis Date  . ERECTILE DYSFUNCTION 08/21/2008  . HYPERTENSION 05/10/2007  . Pure hypercholesterolemia 04/24/2008  . Morbid obesity    Past Surgical History  Procedure Laterality Date  . Wisdom tooth extraction    . Laparoscopic gastric banding  07/22/2012    Procedure: LAPAROSCOPIC GASTRIC BANDING;  Surgeon: Atilano InaEric M Wilson, MD,FACS;  Location: [facility];  Service: General;  Laterality: N/A;  . Hiatal hernia repair  07/22/2012    Procedure: HERNIA REPAIR HIATAL;  Surgeon: Atilano InaEric M Wilson, MD,FACS;  Location: [facility];  Service: General;  Laterality: N/A;   Family History  Problem Relation Age of Onset  . Breast cancer Maternal Grandmother   . Prostate cancer Paternal Grandfather    History  Substance Use Topics  . Smoking status: Former Smoker -- 0.30 packs/day    Types: Cigarettes    Quit date: 09/04/1998  . Smokeless tobacco: Never Used  . Alcohol Use: No    Review of Systems  Constitutional: Negative.  Negative for fever.  Gastrointestinal: Negative.  Negative for abdominal  pain and bowel incontinence.  Genitourinary: Negative.  Negative for bladder incontinence.  Musculoskeletal: Positive for back pain.  Neurological: Positive for tingling. Negative for weakness, numbness and paresthesias.    Allergies  Review of patient's allergies indicates no known allergies.  Home Medications   Prior to Admission medications   Medication Sig Start Date End Date Taking? Authorizing Provider  lisinopril-hydrochlorothiazide (PRINZIDE,ZESTORETIC) 20-25 MG per tablet TAKE ONE TABLET BY MOUTH ONCE DAILY 12/09/14  Yes Gordy SaversPeter F Kwiatkowski, MD  fluticasone [facility](FLONASE) 50 MCG/ACT nasal spray [facility] 03/23/14   Gordy SaversPeter F Kwiatkowski, MD  loratadine (CLARITIN) 10 MG tablet Take 10 mg by mouth daily.      Historical Provider, MD  methylPREDNISolone (MEDROL DOSEPAK) 4 MG TBPK tablet follow package directions, start on Monday 5/9 and take until finished. 01/10/15   Linna HoffJames D Kristol Almanzar, MD  simvastatin (ZOCOR) 40 MG tablet TAKE ONE TABLET BY MOUTH AT BEDTIME 03/23/14   Gordy SaversPeter F Kwiatkowski, MD   BP 147/86 mmHg  Pulse 85  Temp(Src) 98.6 F (37 C) (Oral)  Resp 16  SpO2 100% Physical Exam  Constitutional: He is oriented to person, place, and time. He appears well-developed and well-nourished. No distress.  Abdominal: Soft. Bowel sounds are normal. There is no tenderness.  Musculoskeletal: Normal range of motion. He exhibits no tenderness.  Neg slr, nl ehl stregnth and sens.  Neurological: He is alert and oriented to person, place, and time. He has normal reflexes. No cranial nerve deficit.  Skin: Skin is warm and  dry.  Nursing note and vitals reviewed.   ED Course  Procedures (including critical care time) Labs Review Labs Reviewed - No data to display  Imaging Review No results found.   MDM   1. Lumbar back pain with radiculopathy affecting left lower extremity        Linna HoffJames D Jayzon Taras, MD 01/10/15 [phone]

## 2015-01-10 NOTE — ED Notes (Signed)
Reports taking grandson in & out of carseat frequently a few weeks ago; started to feel low back pain at that time.  Pain has continued intermittently with intermittent numbness/tingling radiating into left buttock and down LLE.  Denies weakness, denies bowel/bladder problems.  Has been taking Advil and doing stretching exercises.

## 2015-01-27 ENCOUNTER — Telehealth: Payer: Self-pay | Admitting: Internal Medicine

## 2015-01-27 NOTE — Telephone Encounter (Signed)
Done

## 2015-01-27 NOTE — Telephone Encounter (Signed)
Adam Adkins, just use any 15 minute slot, same day if need to.

## 2015-01-27 NOTE — Telephone Encounter (Signed)
Patient was seen at Urgent Care on 01/10/15 and is not feeling any better.  There aren't any 30 minute OV slots for me to use.  Please advise where I can schedule patient for the appointment.

## 2015-01-28 ENCOUNTER — Ambulatory Visit (INDEPENDENT_AMBULATORY_CARE_PROVIDER_SITE_OTHER): Payer: BC Managed Care – PPO | Admitting: Internal Medicine

## 2015-01-28 ENCOUNTER — Encounter: Payer: Self-pay | Admitting: Internal Medicine

## 2015-01-28 VITALS — BP 100/70 | HR 96 | Temp 98.0°F | Resp 20 | Ht 68.0 in | Wt 252.0 lb

## 2015-01-28 DIAGNOSIS — I10 Essential (primary) hypertension: Secondary | ICD-10-CM

## 2015-01-28 DIAGNOSIS — M5417 Radiculopathy, lumbosacral region: Secondary | ICD-10-CM | POA: Diagnosis not present

## 2015-01-28 MED ORDER — PREDNISONE 20 MG PO TABS: 20.0000 mg | ORAL_TABLET | Freq: Two times a day (BID) | ORAL | Status: DC

## 2015-01-28 NOTE — Progress Notes (Signed)
Pre visit review using our clinic review tool, if applicable. No additional management support is needed unless otherwise documented below in the visit note. 

## 2015-01-28 NOTE — Patient Instructions (Addendum)
Most patients with low back pain will improve with time over the next two to 6 weeks.  Keep active but avoid any activities that cause pain.  Apply moist heat to the low back area several times daily.  Sciatica Sciatica is pain, weakness, numbness, or tingling along the path of the sciatic nerve. The nerve starts in the lower back and runs down the back of each leg. The nerve controls the muscles in the lower leg and in the back of the knee, while also providing sensation to the back of the thigh, lower leg, and the sole of your foot. Sciatica is a symptom of another medical condition. For instance, nerve damage or certain conditions, such as a herniated disk or bone spur on the spine, pinch or put pressure on the sciatic nerve. This causes the pain, weakness, or other sensations normally associated with sciatica. Generally, sciatica only affects one side of the body. CAUSES   Herniated or slipped disc.  Degenerative disk disease.  A pain disorder involving the narrow muscle in the buttocks (piriformis syndrome).  Pelvic injury or fracture.  Pregnancy.  Tumor (rare). SYMPTOMS  Symptoms can vary from mild to very severe. The symptoms usually travel from the low back to the buttocks and down the back of the leg. Symptoms can include:  Mild tingling or dull aches in the lower back, leg, or hip.  Numbness in the back of the calf or sole of the foot.  Burning sensations in the lower back, leg, or hip.  Sharp pains in the lower back, leg, or hip.  Leg weakness.  Severe back pain inhibiting movement. These symptoms may get worse with coughing, sneezing, laughing, or prolonged sitting or standing. Also, being overweight may worsen symptoms. DIAGNOSIS  Your caregiver will perform a physical exam to look for common symptoms of sciatica. He or she may ask you to do certain movements or activities that would trigger sciatic nerve pain. Other tests may be performed to find the cause of the  sciatica. These may include:  Blood tests.  X-rays.  Imaging tests, such as an MRI or CT scan. TREATMENT  Treatment is directed at the cause of the sciatic pain. Sometimes, treatment is not necessary and the pain and discomfort goes away on its own. If treatment is needed, your caregiver may suggest:  Over-the-counter medicines to relieve pain.  Prescription medicines, such as anti-inflammatory medicine, muscle relaxants, or narcotics.  Applying heat or ice to the painful area.  Steroid injections to lessen pain, irritation, and inflammation around the nerve.  Reducing activity during periods of pain.  Exercising and stretching to strengthen your abdomen and improve flexibility of your spine. Your caregiver may suggest losing weight if the extra weight makes the back pain worse.  Physical therapy.  Surgery to eliminate what is pressing or pinching the nerve, such as a bone spur or part of a herniated disk. HOME CARE INSTRUCTIONS   Only take over-the-counter or prescription medicines for pain or discomfort as directed by your caregiver.  Apply ice to the affected area for 20 minutes, 3-4 times a day for the first 48-72 hours. Then try heat in the same way.  Exercise, stretch, or perform your usual activities if these do not aggravate your pain.  Attend physical therapy sessions as directed by your caregiver.  Keep all follow-up appointments as directed by your caregiver.  Do not wear high heels or shoes that do not provide proper support.  Check your mattress to see if   it is too soft. A firm mattress may lessen your pain and discomfort. SEEK IMMEDIATE MEDICAL CARE IF:   You lose control of your bowel or bladder (incontinence).  You have increasing weakness in the lower back, pelvis, buttocks, or legs.  You have redness or swelling of your back.  You have a burning sensation when you urinate.  You have pain that gets worse when you lie down or awakens you at  night.  Your pain is worse than you have experienced in the past.  Your pain is lasting longer than 4 weeks.  You are suddenly losing weight without reason. MAKE SURE YOU:  Understand these instructions.  Will watch your condition.  Will get help right away if you are not doing well or get worse. Document Released: 08/15/2001 Document Revised: 02/20/2012 Document Reviewed: 12/31/2011 [employer] Patient Information 2015 [employer]. This information is not intended to replace advice given to you by your health care provider. Make sure you discuss any questions you have with your health care provider.  

## 2015-01-28 NOTE — Progress Notes (Signed)
Subjective:    Patient ID: Adam Adkins, male    DOB: April 26, 1971, 44 y.o.   MRN: [id number]  HPI 44 year old patient who was stable until about 1 month ago when he was placed in a ranch child in a car seat in a awkward position and felt a slight twinge in his left low back area.  He socially develop some numbness involving the left buttock and thigh area.  About 2 weeks ago he was seen at the urgent care and received the Depo-Medrol for his back and leg symptoms.  He has been on some Advil.  At the present time.  He complains of some paresthesias involving his left leg to the left lateral calf region.  Denies motor weakness.  He does have some mild back discomfort that is aggravated by standing and movement and resolves with sitting and rest.  No history of chronic low back pain. History of hypertension which has been well managed. He does have a history of obesity and is status post gastric banding.  Past Medical History  Diagnosis Date  . ERECTILE DYSFUNCTION 08/21/2008  . HYPERTENSION 05/10/2007  . Pure hypercholesterolemia 04/24/2008  . Morbid obesity     History   Social History  . Marital Status: Married    Spouse Name: N/A  . Number of Children: N/A  . Years of Education: N/A   Occupational History  . Not on file.   Social History Main Topics  . Smoking status: Former Smoker -- 0.30 packs/day    Types: Cigarettes    Quit date: 09/04/1998  . Smokeless tobacco: Never Used  . Alcohol Use: No  . Drug Use: No  . Sexual Activity: Not on file   Other Topics Concern  . Not on file   Social History Narrative    Past Surgical History  Procedure Laterality Date  . Wisdom tooth extraction    . Laparoscopic gastric banding  07/22/2012    Procedure: LAPAROSCOPIC GASTRIC BANDING;  Surgeon: Atilano Ina, MD,FACS;  Location: [facility];  Service: General;  Laterality: N/A;  . Hiatal hernia repair  07/22/2012    Procedure: HERNIA REPAIR HIATAL;  Surgeon: Atilano Ina, MD,FACS;   Location: [facility];  Service: General;  Laterality: N/A;    Family History  Problem Relation Age of Onset  . Breast cancer Maternal Grandmother   . Prostate cancer Paternal Grandfather     No Known Allergies  Current Outpatient Prescriptions on File Prior to Visit  Medication Sig Dispense Refill  . fluticasone (FLONASE) 50 MCG/ACT nasal spray USE ONE SPRAY IN EACH NOSTRIL EVERY DAY 16 g 5  . lisinopril-hydrochlorothiazide (PRINZIDE,ZESTORETIC) 20-25 MG per tablet TAKE ONE TABLET BY MOUTH ONCE DAILY 90 tablet 0  . loratadine (CLARITIN) 10 MG tablet Take 10 mg by mouth daily.      . simvastatin (ZOCOR) 40 MG tablet TAKE ONE TABLET BY MOUTH AT BEDTIME 90 tablet 1   No current facility-administered medications on file prior to visit.    BP 100/70 mmHg  Pulse 96  Temp(Src) 98 F (36.7 C) (Oral)  Resp 20  Ht  (1.727 m)  Wt 252 lb (114.306 kg)  BMI 38.33 kg/m2  SpO2 98%      Review of Systems  Constitutional: Negative for fever, chills, appetite change and fatigue.  HENT: Negative for congestion, dental problem, ear pain, hearing loss, sore throat, tinnitus, trouble swallowing and voice change.   Eyes: Negative for pain, discharge and visual disturbance.  Respiratory:  Negative for cough, chest tightness, wheezing and stridor.   Cardiovascular: Negative for chest pain, palpitations and leg swelling.  Gastrointestinal: Negative for nausea, vomiting, abdominal pain, diarrhea, constipation, blood in stool and abdominal distention.  Genitourinary: Negative for urgency, hematuria, flank pain, discharge, difficulty urinating and genital sores.  Musculoskeletal: Positive for back pain. Negative for myalgias, joint swelling, arthralgias, gait problem and neck stiffness.  Skin: Negative for rash.  Neurological: Positive for numbness. Negative for dizziness, syncope, speech difficulty, weakness and headaches.  Hematological: Negative for adenopathy. Does not bruise/bleed easily.    Psychiatric/Behavioral: Negative for behavioral problems and dysphoric mood. The patient is not nervous/anxious.        Objective:   Physical Exam  Musculoskeletal:  Negative straight leg test Patellar reflexes brisk Achilles reflexes.  Both blunted symmetrically  Suggestion of some very mild left dorsiflexion          Assessment & Plan:   L5 radiculopathy.  We'll check lumbar MRI due to weakness of dorsiflexion . We'll treat with the oral prednisone for the next week and further observed.  He will report any new or worsening symptoms.  Consider for lumbar MRI.  If unimproved Hypertension, stable

## 2015-01-30 ENCOUNTER — Ambulatory Visit
Admission: RE | Admit: 2015-01-30 | Discharge: 2015-01-30 | Disposition: A | Discharge status: Home / Self Care | Payer: BC Managed Care – PPO | Source: Ambulatory Visit | Attending: Internal Medicine | Admitting: Internal Medicine

## 2015-01-30 DIAGNOSIS — M5417 Radiculopathy, lumbosacral region: Secondary | ICD-10-CM

## 2015-02-02 ENCOUNTER — Other Ambulatory Visit: Payer: Self-pay | Admitting: Internal Medicine

## 2015-02-02 ENCOUNTER — Telehealth: Payer: Self-pay | Admitting: Internal Medicine

## 2015-02-02 DIAGNOSIS — R937 Abnormal findings on diagnostic imaging of other parts of musculoskeletal system: Secondary | ICD-10-CM

## 2015-02-02 MED ORDER — METHOCARBAMOL 500 MG PO TABS: 500.0000 mg | ORAL_TABLET | Freq: Three times a day (TID) | ORAL | Status: DC | PRN

## 2015-02-02 NOTE — Telephone Encounter (Signed)
OK  #30 one TID prn

## 2015-02-02 NOTE — Telephone Encounter (Signed)
Pt wife is calling to let md know her hus was taking methocarbamol 500 mg. [facility] [place]

## 2015-02-02 NOTE — Telephone Encounter (Signed)
Spoke to pt's wife Chucky MayMarita, todl her Rx for Methocarbamol was sent to pharmacy. Marita said she will let pt know.

## 2015-02-03 ENCOUNTER — Telehealth: Payer: Self-pay | Admitting: Internal Medicine

## 2015-02-03 NOTE — Telephone Encounter (Signed)
Pt has a question concerning ct scan and would like nurse or md to call him back. Pt is aware md out of office this afternoon

## 2015-02-04 NOTE — Telephone Encounter (Signed)
Spoke to Adam Adkins, he wanted to know if the CT Scan is going to fine the same thing that was found in 2013 with Dr. Aldean AstWison. 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 Told Adam Adkins I am not sure but the CT scan is more in depth study and it is with contrast so they will be able to see more. Adam Adkins verbalized understanding and said CT scan is for tomorrow. Told him okay.

## 2015-02-05 ENCOUNTER — Telehealth: Payer: Self-pay | Admitting: Internal Medicine

## 2015-02-05 ENCOUNTER — Ambulatory Visit (INDEPENDENT_AMBULATORY_CARE_PROVIDER_SITE_OTHER)
Admission: RE | Admit: 2015-02-05 | Discharge: 2015-02-05 | Disposition: A | Discharge status: Home / Self Care | Payer: BC Managed Care – PPO | Source: Ambulatory Visit | Attending: Internal Medicine | Admitting: Internal Medicine

## 2015-02-05 ENCOUNTER — Inpatient Hospital Stay: Admission: RE | Admit: 2015-02-05 | Payer: BC Managed Care – PPO | Source: Ambulatory Visit

## 2015-02-05 DIAGNOSIS — R937 Abnormal findings on diagnostic imaging of other parts of musculoskeletal system: Secondary | ICD-10-CM

## 2015-02-05 MED ORDER — IOHEXOL 300 MG/ML  SOLN
100.0000 mL | Freq: Once | INTRAMUSCULAR | Status: AC | PRN
  Administered 2015-02-05: 100 mL via INTRAVENOUS

## 2015-02-05 NOTE — Telephone Encounter (Signed)
Dr. Kirtland BouchardK, discussed with pt and appt made for Monday at 1:15 with Dr.K.

## 2015-02-05 NOTE — Telephone Encounter (Signed)
Pt had ct scan and would like results

## 2015-02-08 ENCOUNTER — Encounter: Payer: Self-pay | Admitting: Internal Medicine

## 2015-02-08 ENCOUNTER — Ambulatory Visit (INDEPENDENT_AMBULATORY_CARE_PROVIDER_SITE_OTHER): Payer: BC Managed Care – PPO | Admitting: Internal Medicine

## 2015-02-08 DIAGNOSIS — I10 Essential (primary) hypertension: Secondary | ICD-10-CM

## 2015-02-08 DIAGNOSIS — E7800 Pure hypercholesterolemia, unspecified: Secondary | ICD-10-CM

## 2015-02-08 DIAGNOSIS — R590 Localized enlarged lymph nodes: Secondary | ICD-10-CM

## 2015-02-08 DIAGNOSIS — R918 Other nonspecific abnormal finding of lung field: Secondary | ICD-10-CM | POA: Diagnosis not present

## 2015-02-08 LAB — LIPID PANEL
CHOLESTEROL: 409 mg/dL — AB (ref 0–200)
HDL: 39.1 mg/dL (ref >39.00)
LDL CALC: 336 mg/dL — AB (ref 0–99)
NonHDL: 369.9
Total CHOL/HDL Ratio: 10
Triglycerides: 168 mg/dL — ABNORMAL HIGH (ref 0.0–149.0)
VLDL: 33.6 mg/dL (ref 0.0–40.0)

## 2015-02-08 LAB — COMPREHENSIVE METABOLIC PANEL
ALBUMIN: 3.9 g/dL (ref 3.5–5.2)
ALT: 12 U/L (ref 0–53)
AST: 12 U/L (ref 0–37)
Alkaline Phosphatase: 59 U/L (ref 39–117)
BILIRUBIN TOTAL: 0.8 mg/dL (ref 0.2–1.2)
BUN: 20 mg/dL (ref 6–23)
CALCIUM: 9.2 mg/dL (ref 8.4–10.5)
CO2: 28 mEq/L (ref 19–32)
Chloride: 98 mEq/L (ref 96–112)
Creatinine, Ser: 0.96 mg/dL (ref 0.40–1.50)
GFR: 108.32 mL/min (ref >60.00)
Glucose, Bld: 99 mg/dL (ref 70–99)
POTASSIUM: 4.5 meq/L (ref 3.5–5.1)
Sodium: 132 mEq/L — ABNORMAL LOW (ref 135–145)
TOTAL PROTEIN: 7.6 g/dL (ref 6.0–8.3)

## 2015-02-08 LAB — CBC WITH DIFFERENTIAL/PLATELET
BASOS PCT: 0 % (ref 0.0–3.0)
Basophils Absolute: 0 10*3/uL (ref 0.0–0.1)
EOS PCT: 0 % (ref 0.0–5.0)
Eosinophils Absolute: 0 10*3/uL (ref 0.0–0.7)
HCT: 45.1 % (ref 39.0–52.0)
HEMOGLOBIN: 15.1 g/dL (ref 13.0–17.0)
Lymphocytes Relative: 7.4 % — ABNORMAL LOW (ref 12.0–46.0)
Lymphs Abs: 0.7 10*3/uL (ref 0.7–4.0)
MCHC: 33.6 g/dL (ref 30.0–36.0)
MCV: 88.2 fl (ref 78.0–100.0)
MONO ABS: 0.3 10*3/uL (ref 0.1–1.0)
Monocytes Relative: 3.1 % (ref 3.0–12.0)
NEUTROS ABS: 7.9 10*3/uL — AB (ref 1.4–7.7)
Neutrophils Relative %: 89.5 % — ABNORMAL HIGH (ref 43.0–77.0)
Platelets: 298 10*3/uL (ref 150.0–400.0)
RBC: 5.11 Mil/uL (ref 4.22–5.81)
RDW: 14.6 % (ref 11.5–15.5)
WBC: 8.8 10*3/uL (ref 4.0–10.5)

## 2015-02-08 LAB — SEDIMENTATION RATE: Sed Rate: 23 mm/hr — ABNORMAL HIGH (ref 0–22)

## 2015-02-08 LAB — TSH: TSH: 0.55 u[IU]/mL (ref 0.35–4.50)

## 2015-02-08 LAB — PSA: PSA: 0.25 ng/mL (ref 0.10–4.00)

## 2015-02-08 NOTE — Progress Notes (Signed)
Subjective:    Patient ID: Adam Adkins, male    DOB: 02/05/1971, 44 y.o.   MRN: [id number]  HPI Diagnosis Lymph node, biopsy, gastro hepatic - GRANULOMATOUS LYMPHADENITIS ASSOCIATED WITH EXTENSIVE FIBROSIS AND SCANTY LYMPHOID COMPONENT. - SEE COMMENT. Microscopic Comment Much of the lymph node shows extensive fibrosis associated with very scanty lymphoid component in the form of small lymphocytes and plasma cells scattered throughout including occasional small clusters. In this background, there are numerous predominately small epithelioid granulomata with lack of necrosis. Many of the granulomata display multinucleated giant cells. A Congo red stain was performed to exclude the possibility of amyloidosis and is negative. Immunohistochemical stains for CD3, CD20, CD138, kappa and lambda were also performed to further evaluate the lymphoid component. The small lymphocytic cells show a mixture of T and B cells associated with relative abundance of plasma cells as seen with CD138. Kappa and lambda stains show polyclonal staining pattern in plasma cells. The presence of scanty lymphoid component in this material with a mixture of T and B cells associated with polyclonal plasma cells strongly argues against a lymphoproliferative process. The presence of extensive granulomatous process in the background associated with dense fibrosis strongly suggests sarcoidosis. However, clinical correlation is needed. 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  At the time of LAP-BAND surgery in 2013, he was noted to have retroperitoneal lymphadenopathy.  Biopsy of a gastrohepatic node was most consistent with sarcoidosis. Due to lumbar radiculopathy.  He had a recent lumbar MRI and abdominal CT scan. Multiple pulmonary nodules were noted on the abdominal CT scan. He has been limited recently due to  low back pain, but prior to the radiculopathy was quite active including running, with no exercise limitations  Stable medical problems include hypertension and dyslipidemia No fever, chills, weight loss or other constitutional complaints. He has noted no lymphadenopathy  His back pain has improved nicely  Past Medical History  Diagnosis Date  . ERECTILE DYSFUNCTION 08/21/2008  . HYPERTENSION 05/10/2007  . Pure hypercholesterolemia 04/24/2008  . Morbid obesity     History   Social History  . Marital Status: Married    Spouse Name: N/A  . Number of Children: N/A  . Years of Education: N/A   Occupational History  . Not on file.   Social History Main Topics  . Smoking status: Former Smoker -- 0.30 packs/day    Types: Cigarettes    Quit date: 09/04/1998  . Smokeless tobacco: Never Used  . Alcohol Use: No  . Drug Use: No  . Sexual Activity: Not on file   Other Topics Concern  . Not on file   Social History Narrative    Past Surgical History  Procedure Laterality Date  . Wisdom tooth extraction    . Laparoscopic gastric banding  07/22/2012    Procedure: LAPAROSCOPIC GASTRIC BANDING;  Surgeon: Atilano InaEric M Wilson, MD,FACS;  Location: [facility];  Service: General;  Laterality: N/A;  . Hiatal hernia repair  07/22/2012    Procedure: HERNIA REPAIR HIATAL;  Surgeon: Atilano InaEric M Wilson, MD,FACS;  Location: [facility];  Service: General;  Laterality: N/A;    Family History  Problem Relation Age of Onset  . Breast cancer Maternal Grandmother   . Prostate cancer Paternal Grandfather     No Known Allergies  Current Outpatient Prescriptions on File Prior to Visit  Medication Sig Dispense Refill  . fluticasone (FLONASE) 50 MCG/ACT nasal spray USE  ONE SPRAY IN EACH NOSTRIL EVERY DAY 16 g 5  . lisinopril-hydrochlorothiazide (PRINZIDE,ZESTORETIC) 20-25 MG per tablet TAKE ONE TABLET BY MOUTH ONCE DAILY 90 tablet 0  . loratadine (CLARITIN) 10 MG tablet Take 10 mg by mouth daily.      .  methocarbamol (ROBAXIN) 500 MG tablet Take 1 tablet (500 mg total) by mouth every 8 (eight) hours as needed for muscle spasms. 30 tablet 0  . predniSONE (DELTASONE) 20 MG tablet Take 1 tablet (20 mg total) by mouth 2 (two) times daily with a meal. 14 tablet 0  . simvastatin (ZOCOR) 40 MG tablet TAKE ONE TABLET BY MOUTH AT BEDTIME 90 tablet 1   No current facility-administered medications on file prior to visit.    BP 150/78 mmHg  Pulse 113  Temp(Src) 98.5 F (36.9 C) (Oral)  Resp 20  Ht  (1.727 m)  Wt 250 lb (113.399 kg)  BMI 38.02 kg/m2  SpO2 97%      Review of Systems  Constitutional: Negative for fever, chills, appetite change and fatigue.  HENT: Negative for congestion, dental problem, ear pain, hearing loss, sore throat, tinnitus, trouble swallowing and voice change.   Eyes: Negative for pain, discharge and visual disturbance.  Respiratory: Negative for cough, chest tightness, wheezing and stridor.   Cardiovascular: Negative for chest pain, palpitations and leg swelling.  Gastrointestinal: Negative for nausea, vomiting, abdominal pain, diarrhea, constipation, blood in stool and abdominal distention.  Genitourinary: Negative for urgency, hematuria, flank pain, discharge, difficulty urinating and genital sores.  Musculoskeletal: Positive for back pain. Negative for myalgias, joint swelling, arthralgias, gait problem and neck stiffness.  Skin: Negative for rash.  Neurological: Negative for dizziness, syncope, speech difficulty, weakness, numbness and headaches.  Hematological: Negative for adenopathy. Does not bruise/bleed easily.  Psychiatric/Behavioral: Negative for behavioral problems and dysphoric mood. The patient is not nervous/anxious.        Objective:   Physical Exam  Constitutional: He is oriented to person, place, and time. He appears well-developed.  Overweight Repeat blood pressure 120/72  Anxious  HENT:  Head: Normocephalic.  Right Ear: External ear  normal.  Left Ear: External ear normal.  Eyes: Conjunctivae and EOM are normal.  Neck: Normal range of motion.  Cardiovascular: Normal rate and normal heart sounds.   Pulmonary/Chest: Breath sounds normal.  Abdominal: Bowel sounds are normal.  Musculoskeletal: Normal range of motion. He exhibits no edema or tenderness.  Lymphadenopathy:    He has no cervical adenopathy.  Neurological: He is alert and oriented to person, place, and time.  Psychiatric: He has a normal mood and affect. His behavior is normal.          Assessment & Plan:   Multiple pulmonary nodules and retroperitoneal lymphadenopathy.  Most compatible with sarcoidosis We'll check screening lab including ace level Check pulmonary function studies The patient was seen by ophthalmology.  6 months ago with normal findings  A PPD skin test was applied

## 2015-02-08 NOTE — Addendum Note (Signed)
Addended by: Jimmye NormanPHANOS, Huston Stonehocker J on: 02/08/2015 02:25 PM   Modules accepted: Orders

## 2015-02-08 NOTE — Patient Instructions (Addendum)
Please see your ophthalmologist for a complete eye examination.  Return in 3 months for follow-up  Sarcoidosis, Schaumann's Disease, Sarcoid of [place] Sarcoidosis appears briefly and heals naturally in 60 to 70 percent of cases, often without the patient knowing or doing anything about it. 20 to 30 percent of patients with sarcoidosis are left with some permanent lung damage. In 10 to 15 percent of the patients, sarcoidosis can become chronic (long lasting). When either the granulomas or fibrosis seriously affect the function of a vital organ (lungs, heart, nervous system, liver, or kidneys), sarcoidosis can be fatal. This occurs 5 to 10 percent of the time. No one can predict how sarcoidosis will progress in an individual patient. The symptoms the patient experiences, the caregiver's findings, and the patient's race can give some clues. Sarcoidosis was once considered a rare disease. We now know that it is a common chronic illness that appears all over the world. It is the most common of the fibrotic (scarring) lung disorders. Anyone can get sarcoidosis. It occurs in all races and in both sexes. The risk is greater if you are a young black adult, especially a black woman, or are of [place], [place], [place], or [place] origin. In sarcoidosis, small lumps (also called nodules or granulomas) develop in multiple organs of the body. These granulomas are small collections of inflamed cells. They commonly appear in the lungs. This is the most common organ affected. They also occur in the lymph nodes (your glands), skin, liver, and eyes. The granulomas vary in the amount of disease they produce from very little with no problems (symptoms) to causing severe illness. The cause of sarcoidosis is not known. It may be due to an abnormal immune reaction in the body. Most people will recover. A few people will develop long lasting conditions that may get worse. Women are affected more often than men. The majority of  those affected are under forty years of age. Because we do not know the cause, we do not have ways to prevent it. SYMPTOMS   Fever.  Loss of appetite.  Night sweats.  Joint pain.  Aching muscles Symptoms vary because the disease affects different parts of the body in different people. Most people who see their caregiver with sarcoidosis have lung problems. The first signs are usually a dry cough and shortness of breath. There may also be wheezing, chest pain, or a cough that brings up bloody mucus. In severe cases, lung function may become so poor that the person cannot perform even the simple routine tasks of daily life. Other symptoms of sarcoidosis are less common than lung symptoms. They can include:  Skin symptoms. Sarcoidosis can appear as a collection of tender, red bumps called erythema nodosum. These bumps usually occur on the face, shins, and arms. They can also occur as a scaly, purplish discoloration on the nose, cheeks, and ears. This is called lupus pernio. Less often, sarcoidosis causes cysts, pimples, or disfiguring over growths of skin. In many cases, the disfiguring over growths develop in areas of scars or tattoos.  Eye symptoms. These include redness, eye pain, and sensitivity to light.  Heart symptoms. These include irregular heartbeat and heart failure.  Other symptoms. A person may have paralyzed facial muscles, seizures, psychiatric symptoms, swollen salivary glands, or bone pain. DIAGNOSIS  Even when there are no symptoms, your caregiver can sometimes pick up signs of sarcoidosis during a routine examination, usually through a chest x-ray or when checking other complaints. The patient's age  and race or ethnic group can raise an additional red flag that a sign or symptom could be related to sarcoidosis.   Enlargement of the salivary or tear glands and cysts in bone tissue may also be caused by sarcoidosis.  You may have had a biopsy done that shows signs of  sarcoidosis. A biopsy is a small tissue sample that is removed for laboratory testing. This tissue sample can be taken from your lung, skin, lip, or another inflamed or abnormal area of the body.  You may have had an abnormal chest X-ray. Although you appear healthy, a chest X-ray ordered for other reasons may turn up abnormalities that suggest sarcoidosis.  Other tests may be needed. These tests may be done to rule out other illnesses or to determine the amount of organ damage caused by sarcoidosis. Some of the most common tests are:  Blood levels of calcium or angiotensin-converting enzyme may be high in people with sarcoidosis.  Blood tests to evaluate how well your liver is functioning.  Lung function tests to measure how well you are breathing.  A complete eye examination. TREATMENT  If sarcoidosis does not cause any problems, treatment may not be necessary. Your caregiver may decide to simply monitor your condition. As part of this monitoring process, you may have frequent office visits, follow-up chest X-rays, and tests of your lung function.If you have signs of moderate or severe lung disease, your doctor may recommend:  A corticosteroid drug, such as prednisone (sold under several brand names).  Corticosteroids also are used to treat sarcoidosis of the eyes, joints, skin, nerves, or heart.  Corticosteroid eye drops may be used for the eyes.  Over-the-counter medications like nonsteroidal anti-inflammatory drugs (NSAID) often are used to treat joint pain first before corticosteroids, which tend to have more side effects.  If corticosteroids are not effective or cause serious side effects, other drugs that alter or suppress the immune system may be used.  In rare cases, when sarcoidosis causes life-threatening lung disease, a lung transplant may be necessary. However, there is some risk that the new lungs also will be attacked by sarcoidosis. SEEK IMMEDIATE MEDICAL CARE IF:   You  suffer from shortness of breath or a lingering cough.  You develop new problems that may be related to the disease. Remember this disease can affect almost all organs of the body and cause many different problems. Document Released: 06/21/2004 Document Revised: 11/13/2011 Document Reviewed: 12/17/2013 [facility] Patient Information 2015 [employer], [place]. This information is not intended to replace advice given to you by your health care provider. Make sure you discuss any questions you have with your health care provider.

## 2015-02-08 NOTE — Progress Notes (Signed)
Pre visit review using our clinic review tool, if applicable. No additional management support is needed unless otherwise documented below in the visit note. 

## 2015-02-09 LAB — HIV ANTIBODY (ROUTINE TESTING W REFLEX): HIV 1&2 Ab, 4th Generation: NONREACTIVE

## 2015-02-10 LAB — TB SKIN TEST
Induration: 0 mm
TB Skin Test: NEGATIVE

## 2015-02-10 LAB — ANGIOTENSIN CONVERTING ENZYME: ANGIOTENSIN-CONVERTING ENZYME: 44 U/L (ref 8–52)

## 2015-02-11 ENCOUNTER — Other Ambulatory Visit: Payer: Self-pay | Admitting: Internal Medicine

## 2015-02-11 DIAGNOSIS — D860 Sarcoidosis of lung: Secondary | ICD-10-CM

## 2015-02-16 ENCOUNTER — Ambulatory Visit (HOSPITAL_COMMUNITY)
Admission: RE | Admit: 2015-02-16 | Discharge: 2015-02-16 | Disposition: A | Discharge status: Home / Self Care | Payer: BC Managed Care – PPO | Source: Ambulatory Visit | Attending: Internal Medicine | Admitting: Internal Medicine

## 2015-02-16 ENCOUNTER — Other Ambulatory Visit (HOSPITAL_COMMUNITY): Payer: Self-pay | Admitting: Respiratory Therapy

## 2015-02-16 DIAGNOSIS — R918 Other nonspecific abnormal finding of lung field: Secondary | ICD-10-CM | POA: Insufficient documentation

## 2015-02-16 DIAGNOSIS — R590 Localized enlarged lymph nodes: Secondary | ICD-10-CM | POA: Diagnosis not present

## 2015-02-16 DIAGNOSIS — D860 Sarcoidosis of lung: Secondary | ICD-10-CM

## 2015-02-16 LAB — PULMONARY FUNCTION TEST
DL/VA % PRED: 120 %
DL/VA: 5.48 ml/min/mmHg/L
DLCO UNC % PRED: 101 %
DLCO unc: 30.09 ml/min/mmHg
FEF 25-75 POST: 4.44 L/s
FEF 25-75 Pre: 4.24 L/sec
FEF2575-%CHANGE-POST: 4 %
FEF2575-%PRED-POST: 134 %
FEF2575-%Pred-Pre: 128 %
FEV1-%Change-Post: 1 %
FEV1-%PRED-PRE: 118 %
FEV1-%Pred-Post: 120 %
FEV1-POST: 3.86 L
FEV1-Pre: 3.81 L
FEV1FVC-%CHANGE-POST: 2 %
FEV1FVC-%Pred-Pre: 103 %
FEV6-%CHANGE-POST: 0 %
FEV6-%Pred-Post: 116 %
FEV6-%Pred-Pre: 116 %
FEV6-PRE: 4.52 L
FEV6-Post: 4.49 L
FEV6FVC-%PRED-POST: 103 %
FEV6FVC-%Pred-Pre: 103 %
FVC-%CHANGE-POST: 0 %
FVC-%PRED-POST: 113 %
FVC-%Pred-Pre: 113 %
FVC-Post: 4.49 L
FVC-Pre: 4.52 L
POST FEV1/FVC RATIO: 86 %
PRE FEV1/FVC RATIO: 84 %
Post FEV6/FVC ratio: 100 %
Pre FEV6/FVC Ratio: 100 %
RV % PRED: 80 %
RV: 1.48 L
TLC % pred: 93 %
TLC: 6.11 L

## 2015-02-16 MED ORDER — ALBUTEROL SULFATE (2.5 MG/3ML) 0.083% IN NEBU
2.5000 mg | INHALATION_SOLUTION | Freq: Once | RESPIRATORY_TRACT | Status: AC
  Administered 2015-02-16: 2.5 mg via RESPIRATORY_TRACT

## 2015-03-08 ENCOUNTER — Other Ambulatory Visit: Payer: Self-pay | Admitting: Internal Medicine

## 2015-03-09 ENCOUNTER — Other Ambulatory Visit: Payer: Self-pay | Admitting: Internal Medicine

## 2015-05-26 ENCOUNTER — Other Ambulatory Visit: Payer: Self-pay | Admitting: Internal Medicine

## 2015-09-14 ENCOUNTER — Ambulatory Visit: Payer: BC Managed Care – PPO | Admitting: Internal Medicine

## 2015-09-15 ENCOUNTER — Encounter: Payer: Self-pay | Admitting: Internal Medicine

## 2015-09-15 ENCOUNTER — Ambulatory Visit (INDEPENDENT_AMBULATORY_CARE_PROVIDER_SITE_OTHER): Payer: BC Managed Care – PPO | Admitting: Internal Medicine

## 2015-09-15 VITALS — BP 118/74 | HR 97 | Temp 98.9°F | Resp 20 | Ht 68.0 in | Wt 261.0 lb

## 2015-09-15 DIAGNOSIS — R590 Localized enlarged lymph nodes: Secondary | ICD-10-CM

## 2015-09-15 DIAGNOSIS — R918 Other nonspecific abnormal finding of lung field: Secondary | ICD-10-CM

## 2015-09-15 DIAGNOSIS — I10 Essential (primary) hypertension: Secondary | ICD-10-CM

## 2015-09-15 DIAGNOSIS — E7800 Pure hypercholesterolemia, unspecified: Secondary | ICD-10-CM

## 2015-09-15 LAB — CBC WITH DIFFERENTIAL/PLATELET
BASOS ABS: 0.1 10*3/uL (ref 0.0–0.1)
Basophils Relative: 1.3 % (ref 0.0–3.0)
Eosinophils Absolute: 0.2 10*3/uL (ref 0.0–0.7)
Eosinophils Relative: 4 % (ref 0.0–5.0)
HEMATOCRIT: 45 % (ref 39.0–52.0)
HEMOGLOBIN: 15.1 g/dL (ref 13.0–17.0)
LYMPHS PCT: 18.8 % (ref 12.0–46.0)
Lymphs Abs: 1.1 10*3/uL (ref 0.7–4.0)
MCHC: 33.5 g/dL (ref 30.0–36.0)
MCV: 89.3 fl (ref 78.0–100.0)
MONOS PCT: 13 % — AB (ref 3.0–12.0)
Monocytes Absolute: 0.7 10*3/uL (ref 0.1–1.0)
NEUTROS ABS: 3.6 10*3/uL (ref 1.4–7.7)
Neutrophils Relative %: 62.9 % (ref 43.0–77.0)
Platelets: 247 10*3/uL (ref 150.0–400.0)
RBC: 5.04 Mil/uL (ref 4.22–5.81)
RDW: 14.1 % (ref 11.5–15.5)
WBC: 5.7 10*3/uL (ref 4.0–10.5)

## 2015-09-15 LAB — LIPID PANEL
CHOL/HDL RATIO: 6
Cholesterol: 219 mg/dL — ABNORMAL HIGH (ref 0–200)
HDL: 34.8 mg/dL — AB (ref >39.00)
LDL CALC: 160 mg/dL — AB (ref 0–99)
NONHDL: 184.63
TRIGLYCERIDES: 123 mg/dL (ref 0.0–149.0)
VLDL: 24.6 mg/dL (ref 0.0–40.0)

## 2015-09-15 LAB — COMPREHENSIVE METABOLIC PANEL
ALK PHOS: 55 U/L (ref 39–117)
ALT: 20 U/L (ref 0–53)
AST: 20 U/L (ref 0–37)
Albumin: 4.2 g/dL (ref 3.5–5.2)
BUN: 21 mg/dL (ref 6–23)
CALCIUM: 10.1 mg/dL (ref 8.4–10.5)
CO2: 33 mEq/L — ABNORMAL HIGH (ref 19–32)
Chloride: 98 mEq/L (ref 96–112)
Creatinine, Ser: 1.13 mg/dL (ref 0.40–1.50)
GFR: 89.51 mL/min (ref >60.00)
GLUCOSE: 92 mg/dL (ref 70–99)
POTASSIUM: 4.2 meq/L (ref 3.5–5.1)
Sodium: 136 mEq/L (ref 135–145)
TOTAL PROTEIN: 7.9 g/dL (ref 6.0–8.3)
Total Bilirubin: 0.6 mg/dL (ref 0.2–1.2)

## 2015-09-15 LAB — SEDIMENTATION RATE: SED RATE: 23 mm/h — AB (ref 0–22)

## 2015-09-15 MED ORDER — SIMVASTATIN 40 MG PO TABS: 40.0000 mg | ORAL_TABLET | Freq: Every day | ORAL | Status: DC

## 2015-09-15 MED ORDER — LISINOPRIL-HYDROCHLOROTHIAZIDE 20-25 MG PO TABS: 1.0000 | ORAL_TABLET | Freq: Every day | ORAL | Status: DC

## 2015-09-15 NOTE — Progress Notes (Signed)
Subjective:    Patient ID: Adam Adkins, male    DOB: 07/02/1971, 45 y.o.   MRN: [id number]  HPI  Wt Readings from Last 3 Encounters:  09/15/15 261 lb (118.389 kg)  02/08/15 250 lb (113.399 kg)  01/28/15 252 lb (114.306 kg)     07/25/2012  Diagnosis Lymph node, biopsy, gastro hepatic - GRANULOMATOUS LYMPHADENITIS ASSOCIATED WITH EXTENSIVE FIBROSIS AND SCANTY LYMPHOID COMPONENT. - SEE COMMENT. Microscopic Comment Much of the lymph node shows extensive fibrosis associated with very scanty lymphoid component in the form of small lymphocytes and plasma cells scattered throughout including occasional small clusters. In this background, there are numerous predominately small epithelioid granulomata with lack of necrosis. Many of the granulomata display multinucleated giant cells. A Congo red stain was performed to exclude the possibility of amyloidosis and is negative. Immunohistochemical stains for CD3, CD20, CD138, kappa and lambda were also performed to further evaluate the lymphoid component. The small lymphocytic cells show a mixture of T and B cells associated with relative abundance of plasma cells as seen with CD138. Kappa and lambda stains show polyclonal staining pattern in plasma cells. The presence of scanty lymphoid component in this material with a mixture of T and B cells associated with polyclonal plasma cells strongly argues against a lymphoproliferative process. The presence of extensive granulomatous process in the background associated with dense fibrosis strongly suggests sarcoidosis. However, clinical correlation is needed. (BNS:gt, 07/25/12) Adam PriestBASSAM SMIR MD Pathologist, Electronic Signature   02/05/2015  CLINICAL DATA: Retroperitoneal adenopathy on recent MR, quest history sarcoidosis, laparoscopic gastric banding, hypertension, obesity, former smoker  EXAM: CT ABDOMEN AND PELVIS WITH CONTRAST  TECHNIQUE: Multidetector CT imaging of the abdomen and  pelvis was performed using the standard protocol following bolus administration of intravenous contrast. Sagittal and coronal MPR images reconstructed from axial data set.  CONTRAST: 100mL OMNIPAQUE IOHEXOL 300 MG/ML SOLN  COMPARISON: Abdomen MR 01/30/2015  FINDINGS: Multiple pulmonary nodules at lung bases including a 10 mm diameter LEFT lower lobe nodule image 1 4 mm RIGHT lower lobe nodule image 4, 9 mm LEFT lower lobe nodule image 8, with multiple additional small nodules bilaterally.  BILATERAL gynecomastia.  Prior laparoscopic gastric banding which appears intact and normally positioned.  Liver, gallbladder, pancreas, kidneys, and adrenal glands normal.  Multiple splenic lesions largest 2.2 x 2.3 cm image 20, uncertain etiology.  Enlarged periportal lymph node anterior to IVC 16 mm short axis image 30.  Numerous additional normal sized retroperitoneal nodes up to 9 mm diameter image 44.  Enlarged RIGHT common iliac node 12 mm diameter image 70.  Enlarged BILATERAL internal iliac nodes up to 11 mm RIGHT and 13 mm LEFT image 78.  Additional scattered mildly prominent external iliac nodes bilaterally.  Scattered atherosclerotic calcifications.  Unremarkable bladder, prostate and ureters.  BILATERAL inguinal hernias containing fat.  Bowel loops normal appearance.  No mass, free fluid, free air or acute osseous findings.  IMPRESSION: Prior laparoscopic gastric banding.  Multiple pulmonary nodules.  Multiple splenic lesions with additional normal and mildly enlarged retroperitoneal and pelvic lymph nodes.  Sarcoidosis can be associated with abdominal nodes but typically not in pelvis and not associated with splenic lesions.  Differential diagnosis includes lymphoma, metastatic disease and infection.  BILATERAL inguinal hernias.   Electronically Signed  By: Adam SouthwardMark Adkins M.D.  On: 02/05/2015 11:56  02/08/2015 45 year old  patient who is seen today for evaluation of probable sarcoidosis. At the time of LAP-BAND surgery in 2013, he was noted to have retroperitoneal lymphadenopathy.  Biopsy of a gastrohepatic node was most consistent with sarcoidosis. Due to lumbar radiculopathy. He had a recent lumbar MRI and abdominal CT scan. Multiple pulmonary nodules were noted on the abdominal CT scan.   09/15/2015.  Further evaluation in June of last year included pulmonary function studies that were normal including normal.  Diffusion.  Laboratory screen fairly unremarkable.  Angiotensin-converting enzyme level normal.  PPD skin test negative Patient presents today complaining of some vague right upper quadrant pain for the past week.  He states this is very mild.  He has been quite anxious about the diagnosis of probable sarcoid. He denies any constitutional complaints including weight loss, anorexia, fever, pruritus.  There has been some modest weight gain He is now on statin therapy, which he continues to tolerate well.  Past Medical History  Diagnosis Date  . ERECTILE DYSFUNCTION 08/21/2008  . HYPERTENSION 05/10/2007  . Pure hypercholesterolemia 04/24/2008  . Morbid obesity (HCC)     Social History   Social History  . Marital Status: Married    Spouse Name: N/A  . Number of Children: N/A  . Years of Education: N/A   Occupational History  . Not on file.   Social History Main Topics  . Smoking status: Former Smoker -- 0.30 packs/day    Types: Cigarettes    Quit date: 09/04/1998  . Smokeless tobacco: Never Used  . Alcohol Use: No  . Drug Use: No  . Sexual Activity: Not on file   Other Topics Concern  . Not on file   Social History Narrative    Past Surgical History  Procedure Laterality Date  . Wisdom tooth extraction    . Laparoscopic gastric banding  07/22/2012    Procedure: LAPAROSCOPIC GASTRIC BANDING;  Surgeon: Atilano Ina, MD,FACS;  Location: [facility];  Service: General;  Laterality: N/A;    . Hiatal hernia repair  07/22/2012    Procedure: HERNIA REPAIR HIATAL;  Surgeon: Atilano Ina, MD,FACS;  Location: [facility];  Service: General;  Laterality: N/A;    Family History  Problem Relation Age of Onset  . Breast cancer Maternal Grandmother   . Prostate cancer Paternal Grandfather     No Known Allergies  Current Outpatient Prescriptions on File Prior to Visit  Medication Sig Dispense Refill  . fluticasone (FLONASE) 50 MCG/ACT nasal spray USE ONE SPRAY IN EACH NOSTRIL EVERY DAY (Patient taking differently: USE ONE SPRAY IN EACH NOSTRIL AS NEEDED) 16 g 5  . loratadine (CLARITIN) 10 MG tablet Take 10 mg by mouth daily as needed.      No current facility-administered medications on file prior to visit.    BP 118/74 mmHg  Pulse 97  Temp(Src) 98.9 F (37.2 C) (Oral)  Resp 20  Ht 5\' 8"  (1.727 m)  Wt 261 lb (118.389 kg)  BMI 39.69 kg/m2  SpO2 98%         Review of Systems  Constitutional: Negative for fever, chills, appetite change and fatigue.  HENT: Negative for congestion, dental problem, ear pain, hearing loss, sore throat, tinnitus, trouble swallowing and voice change.   Eyes: Negative for pain, discharge and visual disturbance.  Respiratory: Negative for cough, chest tightness, wheezing and stridor.   Cardiovascular: Negative for chest pain, palpitations and leg swelling.  Gastrointestinal: Positive for abdominal pain. Negative for nausea, vomiting, diarrhea, constipation, blood in stool and abdominal distention.  Genitourinary: Negative for urgency, hematuria, flank pain, discharge, difficulty urinating and genital sores.  Musculoskeletal: Negative for myalgias, back pain, joint  swelling, arthralgias, gait problem and neck stiffness.  Skin: Negative for rash.  Neurological: Negative for dizziness, syncope, speech difficulty, weakness, numbness and headaches.  Hematological: Negative for adenopathy. Does not bruise/bleed easily.  Psychiatric/Behavioral:  Negative for behavioral problems and dysphoric mood. The patient is not nervous/anxious.        Objective:   Physical Exam  Constitutional: He is oriented to person, place, and time. He appears well-developed.  Weight 261 Obese Clinically, appears well, although mildly anxious Afebrile  Blood pressure well controlled  HENT:  Head: Normocephalic.  Right Ear: External ear normal.  Left Ear: External ear normal.  Eyes: Conjunctivae and EOM are normal.  Neck: Normal range of motion.  Cardiovascular: Normal rate and normal heart sounds.   Pulmonary/Chest: Breath sounds normal.  Abdominal: Bowel sounds are normal.  Suggestion of hepatomegaly on deep inspiration  Musculoskeletal: Normal range of motion. He exhibits no edema or tenderness.   No cervical, axillary or inguinal adenopathy  Lymphadenopathy:    He has no cervical adenopathy.  Neurological: He is alert and oriented to person, place, and time.  Psychiatric: He has a normal mood and affect. His behavior is normal.          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  Patient is quite anxious about the differential diagnosis of the retroperitoneal lymphadenopathy.  He appears to be clinically stable.  We'll check updated lab including angiotensin-converting enzyme level.  Refer hematology/oncology.  Patient has a normal angiotensin converting enzyme level and features on abdominal CT scan atypical for sarcoid.  Exogenous obesity Hypertension, well-controlled Dyslipidemia.  Will review a lipid profile

## 2015-09-15 NOTE — Patient Instructions (Signed)
Hematology consultation as discussed  Report any clinical change such as weight loss, poor appetite, swollen lymph glands or itching or fever  Limit your sodium (Salt) intake  Please check your blood pressure on a regular basis.  If it is consistently greater than 150/90, please make an office appointment.  Return in 6 months for follow-up

## 2015-09-15 NOTE — Progress Notes (Signed)
Pre visit review using our clinic review tool, if applicable. No additional management support is needed unless otherwise documented below in the visit note. 

## 2015-09-16 LAB — ANGIOTENSIN CONVERTING ENZYME: Angiotensin-Converting Enzyme: 4 U/L — ABNORMAL LOW (ref 8–52)

## 2015-09-24 ENCOUNTER — Ambulatory Visit: Payer: BC Managed Care – PPO | Admitting: Oncology

## 2015-09-27 ENCOUNTER — Telehealth: Payer: Self-pay | Admitting: Oncology

## 2015-09-27 NOTE — Telephone Encounter (Signed)
LT MESS TO RESCHEDULE NEW PT REFERRAL APPT.

## 2015-10-01 ENCOUNTER — Telehealth: Payer: Self-pay | Admitting: Oncology

## 2015-10-01 NOTE — Telephone Encounter (Signed)
Lt mess with wife regarding new pt referral.

## 2015-10-19 ENCOUNTER — Telehealth: Payer: Self-pay | Admitting: Internal Medicine

## 2015-10-19 MED ORDER — SIMVASTATIN 40 MG PO TABS: 80.0000 mg | ORAL_TABLET | Freq: Every day | ORAL | Status: DC

## 2015-10-19 NOTE — Telephone Encounter (Signed)
Left message on voicemail to call office.  

## 2015-10-19 NOTE — Telephone Encounter (Signed)
Patient needs a refill on his Simvastatin medication, but the pharmacy will not refill it because they said it's not time.  The problem is, he has been doubling this prescription, (per the doctor's orders) and he is out of the meds.  Please advise.

## 2015-10-19 NOTE — Telephone Encounter (Signed)
Pt called back, told him I do not have that you are on Simvastatin 80 mg I have 40 mg. Pt said Dr. Kirtland Bouchard told him to continue 80 mg due to cholesterol elevated. Told pt okay will send new Rx to pharmacy. Pt verbalized understanding.

## 2016-01-14 ENCOUNTER — Other Ambulatory Visit: Payer: Self-pay | Admitting: Internal Medicine

## 2016-05-15 ENCOUNTER — Encounter: Payer: Self-pay | Admitting: Internal Medicine

## 2016-05-15 ENCOUNTER — Ambulatory Visit (INDEPENDENT_AMBULATORY_CARE_PROVIDER_SITE_OTHER): Payer: BC Managed Care – PPO | Admitting: Internal Medicine

## 2016-05-15 VITALS — BP 126/70 | HR 102 | Temp 98.5°F | Resp 20 | Ht 68.0 in | Wt 256.5 lb

## 2016-05-15 DIAGNOSIS — E7800 Pure hypercholesterolemia, unspecified: Secondary | ICD-10-CM

## 2016-05-15 DIAGNOSIS — I10 Essential (primary) hypertension: Secondary | ICD-10-CM | POA: Diagnosis not present

## 2016-05-15 DIAGNOSIS — D869 Sarcoidosis, unspecified: Secondary | ICD-10-CM | POA: Insufficient documentation

## 2016-05-15 NOTE — Patient Instructions (Signed)
Limit your sodium (Salt) intake  Please check your blood pressure on a regular basis.  If it is consistently greater than 150/90, please make an office appointment.  Return in 6 months for follow-up   

## 2016-05-15 NOTE — Progress Notes (Signed)
Subjective:    Patient ID: Adam Adkins, male    DOB: 03/26/1971, 45 y.o.   MRN: [id number]  HPI  45 year old patient who is seen today for follow-up.  He has a history of essential hypertension.  He also has a history of dyslipidemia.  He remains on statin therapy.  He has allergic rhinitis.  He does have a history of sarcoid, which has been stable.  He did have an eye examination about one year ago. He has exogenous obesity and status post lap band surgery  Past Medical History:  Diagnosis Date  . ERECTILE DYSFUNCTION 08/21/2008  . HYPERTENSION 05/10/2007  . Morbid obesity (HCC)   . Pure hypercholesterolemia 04/24/2008     Social History   Social History  . Marital status: Married    Spouse name: N/A  . Number of children: N/A  . Years of education: N/A   Occupational History  . Not on file.   Social History Main Topics  . Smoking status: Former Smoker    Packs/day: 0.30    Types: Cigarettes    Quit date: 09/04/1998  . Smokeless tobacco: Never Used  . Alcohol use No  . Drug use: No  . Sexual activity: Not on file   Other Topics Concern  . Not on file   Social History Narrative  . No narrative on file    Past Surgical History:  Procedure Laterality Date  . HIATAL HERNIA REPAIR  07/22/2012   Procedure: HERNIA REPAIR HIATAL;  Surgeon: Atilano InaEric M Wilson, MD,FACS;  Location: [facility];  Service: General;  Laterality: N/A;  . LAPAROSCOPIC GASTRIC BANDING  07/22/2012   Procedure: LAPAROSCOPIC GASTRIC BANDING;  Surgeon: Atilano InaEric M Wilson, MD,FACS;  Location: [facility];  Service: General;  Laterality: N/A;  . WISDOM TOOTH EXTRACTION      Family History  Problem Relation Age of Onset  . Breast cancer Maternal Grandmother   . Prostate cancer Paternal Grandfather     No Known Allergies  Current Outpatient Prescriptions on File Prior to Visit  Medication Sig Dispense Refill  . fluticasone (FLONASE) 50 MCG/ACT nasal spray USE ONE SPRAY IN EACH NOSTRIL EVERY DAY (Patient taking  differently: USE ONE SPRAY IN EACH NOSTRIL AS NEEDED) 16 g 5  . lisinopril-hydrochlorothiazide (PRINZIDE,ZESTORETIC) 20-25 MG tablet TAKE ONE TABLET BY MOUTH DAILY 90 tablet 1  . simvastatin (ZOCOR) 40 MG tablet Take 2 tablets (80 mg total) by mouth at bedtime. 180 tablet 3  . loratadine (CLARITIN) 10 MG tablet Take 10 mg by mouth daily as needed.      No current facility-administered medications on file prior to visit.     BP 126/70 (BP Location: Right Arm, Patient Position: Sitting, Cuff Size: Large)   Pulse (!) 102   Temp 98.5 F (36.9 C) (Oral)   Resp 20   Ht 5\' 8"  (1.727 m)   Wt 256 lb 8 oz (116.3 kg)   SpO2 98%   BMI 39.00 kg/m     Review of Systems  Constitutional: Negative for appetite change, chills, fatigue and fever.  HENT: Negative for congestion, dental problem, ear pain, hearing loss, sore throat, tinnitus, trouble swallowing and voice change.   Eyes: Negative for pain, discharge and visual disturbance.  Respiratory: Negative for cough, chest tightness, wheezing and stridor.   Cardiovascular: Negative for chest pain, palpitations and leg swelling.  Gastrointestinal: Negative for abdominal distention, abdominal pain, blood in stool, constipation, diarrhea, nausea and vomiting.  Genitourinary: Negative for difficulty urinating, discharge, flank pain,  genital sores, hematuria and urgency.  Musculoskeletal: Negative for arthralgias, back pain, gait problem, joint swelling, myalgias and neck stiffness.  Skin: Positive for rash.  Neurological: Negative for dizziness, syncope, speech difficulty, weakness, numbness and headaches.  Hematological: Negative for adenopathy. Does not bruise/bleed easily.  Psychiatric/Behavioral: Negative for behavioral problems and dysphoric mood. The patient is not nervous/anxious.        Objective:   Physical Exam  Constitutional: He is oriented to person, place, and time. He appears well-developed.  HENT:  Head: Normocephalic.  Right  Ear: External ear normal.  Left Ear: External ear normal.  Eyes: Conjunctivae and EOM are normal.  Neck: Normal range of motion.  Cardiovascular: Normal rate and normal heart sounds.   Pulmonary/Chest: Breath sounds normal.  Abdominal: Bowel sounds are normal.  Musculoskeletal: Normal range of motion. He exhibits no edema or tenderness.  Neurological: He is alert and oriented to person, place, and time.  Skin:  Scattered multiple small 1-2 mm papular lesions over both elbows  Psychiatric: He has a normal mood and affect. His behavior is normal.          Assessment & Plan:   Essential hypertension, well-controlled Exogenous obesity.  Status post lap band surgery Dyslipidemia.  Continue statin therapy Sarcoidosis  Schedule CPX  Rogelia Boga, MD

## 2016-05-15 NOTE — Progress Notes (Signed)
Pre visit review using our clinic review tool, if applicable. No additional management support is needed unless otherwise documented below in the visit note. 

## 2016-08-09 ENCOUNTER — Other Ambulatory Visit: Payer: Self-pay | Admitting: Internal Medicine

## 2016-11-09 ENCOUNTER — Other Ambulatory Visit: Payer: Self-pay | Admitting: Internal Medicine

## 2016-12-15 ENCOUNTER — Encounter: Payer: BC Managed Care – PPO | Admitting: Internal Medicine

## 2017-02-22 ENCOUNTER — Encounter (HOSPITAL_COMMUNITY): Payer: Self-pay

## 2017-05-24 ENCOUNTER — Encounter: Payer: Self-pay | Admitting: Internal Medicine

## 2017-06-21 ENCOUNTER — Encounter: Payer: Self-pay | Admitting: Internal Medicine

## 2017-06-21 ENCOUNTER — Ambulatory Visit (INDEPENDENT_AMBULATORY_CARE_PROVIDER_SITE_OTHER): Payer: BC Managed Care – PPO | Admitting: Internal Medicine

## 2017-06-21 VITALS — BP 140/70 | HR 100 | Temp 98.0°F | Ht 68.0 in | Wt 253.0 lb

## 2017-06-21 DIAGNOSIS — Z125 Encounter for screening for malignant neoplasm of prostate: Secondary | ICD-10-CM | POA: Diagnosis not present

## 2017-06-21 DIAGNOSIS — Z0000 Encounter for general adult medical examination without abnormal findings: Secondary | ICD-10-CM | POA: Diagnosis not present

## 2017-06-21 LAB — TSH: TSH: 0.71 u[IU]/mL (ref 0.35–4.50)

## 2017-06-21 LAB — CBC WITH DIFFERENTIAL/PLATELET
Basophils Absolute: 0.1 10*3/uL (ref 0.0–0.1)
Basophils Relative: 2 % (ref 0.0–3.0)
EOS PCT: 5 % (ref 0.0–5.0)
Eosinophils Absolute: 0.3 10*3/uL (ref 0.0–0.7)
HCT: 43.3 % (ref 39.0–52.0)
Hemoglobin: 14.5 g/dL (ref 13.0–17.0)
Lymphocytes Relative: 23.1 % (ref 12.0–46.0)
Lymphs Abs: 1.2 10*3/uL (ref 0.7–4.0)
MCHC: 33.4 g/dL (ref 30.0–36.0)
MCV: 91.5 fl (ref 78.0–100.0)
MONO ABS: 0.6 10*3/uL (ref 0.1–1.0)
MONOS PCT: 13 % — AB (ref 3.0–12.0)
NEUTROS PCT: 56.9 % (ref 43.0–77.0)
Neutro Abs: 2.8 10*3/uL (ref 1.4–7.7)
Platelets: 238 10*3/uL (ref 150.0–400.0)
RBC: 4.73 Mil/uL (ref 4.22–5.81)
RDW: 13.4 % (ref 11.5–15.5)
WBC: 5 10*3/uL (ref 4.0–10.5)

## 2017-06-21 LAB — LIPID PANEL
CHOLESTEROL: 212 mg/dL — AB (ref 0–200)
HDL: 36.4 mg/dL — ABNORMAL LOW (ref >39.00)
LDL CALC: 148 mg/dL — AB (ref 0–99)
NonHDL: 175.92
TRIGLYCERIDES: 140 mg/dL (ref 0.0–149.0)
Total CHOL/HDL Ratio: 6
VLDL: 28 mg/dL (ref 0.0–40.0)

## 2017-06-21 LAB — COMPREHENSIVE METABOLIC PANEL
ALBUMIN: 4.2 g/dL (ref 3.5–5.2)
ALT: 19 U/L (ref 0–53)
AST: 20 U/L (ref 0–37)
Alkaline Phosphatase: 45 U/L (ref 39–117)
BUN: 16 mg/dL (ref 6–23)
CALCIUM: 9.6 mg/dL (ref 8.4–10.5)
CHLORIDE: 99 meq/L (ref 96–112)
CO2: 31 mEq/L (ref 19–32)
CREATININE: 1.07 mg/dL (ref 0.40–1.50)
GFR: 94.61 mL/min (ref >60.00)
Glucose, Bld: 93 mg/dL (ref 70–99)
POTASSIUM: 3.8 meq/L (ref 3.5–5.1)
Sodium: 137 mEq/L (ref 135–145)
Total Bilirubin: 0.8 mg/dL (ref 0.2–1.2)
Total Protein: 7.3 g/dL (ref 6.0–8.3)

## 2017-06-21 LAB — PSA: PSA: 0.36 ng/mL (ref 0.10–4.00)

## 2017-06-21 MED ORDER — LISINOPRIL-HYDROCHLOROTHIAZIDE 20-25 MG PO TABS: 1.0000 | ORAL_TABLET | Freq: Every day | ORAL | 3 refills | Status: DC

## 2017-06-21 MED ORDER — ATORVASTATIN CALCIUM 80 MG PO TABS: 80.0000 mg | ORAL_TABLET | Freq: Every day | ORAL | 3 refills | Status: DC

## 2017-06-21 MED ORDER — SILDENAFIL CITRATE 20 MG PO TABS: ORAL_TABLET | ORAL | 4 refills | Status: DC

## 2017-06-21 NOTE — Progress Notes (Signed)
Subjective:    Patient ID: Adam Adkins, male    DOB: [birth year], 46 y.o.   MRN: [id number]  HPI  46 year old patient who has a history of essential hypertension. He is seen today for an annual exam. He has a history of dyslipidemia and also sarcoidosis. He was noted have retroperitoneal lymphadenopathy at the time of LAP-BAND surgery in 2013.  Biopsy was obtained at that time He remains on statin therapy, which he continues to tolerate well  His weight has been stable.  No constitutional complaints.  Family history.  Mother age 40 in excellent health.  Father age 30.  Has a history of BPH.  4 sisters in good health.  One sister with a history of ocular sarcoid. Maternal grandmother with breast cancer.  Paternal grandfather with prostate cancer  Past Medical History:  Diagnosis Date  . ERECTILE DYSFUNCTION 08/21/2008  . HYPERTENSION 05/10/2007  . Morbid obesity (HCC)   . Pure hypercholesterolemia 04/24/2008     Social History   Social History  . Marital status: Married    Spouse name: N/A  . Number of children: N/A  . Years of education: N/A   Occupational History  . Not on file.   Social History Main Topics  . Smoking status: Former Smoker    Packs/day: 0.30    Types: Cigarettes    Quit date: 09/04/1998  . Smokeless tobacco: Never Used  . Alcohol use No  . Drug use: No  . Sexual activity: Not on file   Other Topics Concern  . Not on file   Social History Narrative  . No narrative on file    Past Surgical History:  Procedure Laterality Date  . HIATAL HERNIA REPAIR  07/22/2012   Procedure: HERNIA REPAIR HIATAL;  Surgeon: Atilano Ina, MD,FACS;  Location: [facility];  Service: General;  Laterality: N/A;  . LAPAROSCOPIC GASTRIC BANDING  07/22/2012   Procedure: LAPAROSCOPIC GASTRIC BANDING;  Surgeon: Atilano Ina, MD,FACS;  Location: [facility];  Service: General;  Laterality: N/A;  . WISDOM TOOTH EXTRACTION      Family History  Problem Relation Age of Onset  .  Breast cancer Maternal Grandmother   . Prostate cancer Paternal Grandfather     No Known Allergies  Current Outpatient Prescriptions on File Prior to Visit  Medication Sig Dispense Refill  . fluticasone (FLONASE) 50 MCG/ACT nasal spray USE ONE SPRAY IN EACH NOSTRIL EVERY DAY (Patient taking differently: USE ONE SPRAY IN EACH NOSTRIL AS NEEDED) 16 g 5  . lisinopril-hydrochlorothiazide (PRINZIDE,ZESTORETIC) 20-25 MG tablet TAKE ONE TABLET BY MOUTH ONCE DAILY 90 tablet 3  . loratadine (CLARITIN) 10 MG tablet Take 10 mg by mouth daily as needed.     . simvastatin (ZOCOR) 40 MG tablet TAKE TWO TABLETS BY MOUTH AT BEDTIME 180 tablet 3   No current facility-administered medications on file prior to visit.     BP 140/70 (BP Location: Left Arm, Patient Position: Sitting, Cuff Size: Normal)   Pulse 100   Temp 98 F (36.7 C) (Oral)   Ht 5\' 8"  (1.727 m)   Wt 253 lb (114.8 kg)   SpO2 96%   BMI 38.47 kg/m     Review of Systems  Constitutional: Negative for appetite change, chills, fatigue and fever.  HENT: Negative for congestion, dental problem, ear pain, hearing loss, sore throat, tinnitus, trouble swallowing and voice change.   Eyes: Negative for pain, discharge and visual disturbance.  Respiratory: Negative for cough, chest tightness, wheezing and  stridor.   Cardiovascular: Negative for chest pain, palpitations and leg swelling.  Gastrointestinal: Negative for abdominal distention, abdominal pain, blood in stool, constipation, diarrhea, nausea and vomiting.  Genitourinary: Negative for difficulty urinating, discharge, flank pain, genital sores, hematuria and urgency.  Musculoskeletal: Negative for arthralgias, back pain, gait problem, joint swelling, myalgias and neck stiffness.  Skin: Negative for rash.  Neurological: Negative for dizziness, syncope, speech difficulty, weakness, numbness and headaches.  Hematological: Negative for adenopathy. Does not bruise/bleed easily.    Psychiatric/Behavioral: Negative for behavioral problems and dysphoric mood. The patient is not nervous/anxious.        Objective:   Physical Exam  Constitutional: He appears well-developed and well-nourished.  Weight 253 Blood pressure 120/70  HENT:  Head: Normocephalic and atraumatic.  Right Ear: External ear normal.  Left Ear: External ear normal.  Nose: Nose normal.  Mouth/Throat: Oropharynx is clear and moist.  Eyes: Pupils are equal, round, and reactive to light. Conjunctivae and EOM are normal. No scleral icterus.  Neck: Normal range of motion. Neck supple. No JVD present. No thyromegaly present.  Cardiovascular: Regular rhythm, normal heart sounds and intact distal pulses.  Exam reveals no gallop and no friction rub.   No murmur heard. Pulmonary/Chest: Effort normal and breath sounds normal. He exhibits no tenderness.  Abdominal: Soft. Bowel sounds are normal. He exhibits no distension and no mass. There is no tenderness.  Status post lap band surgery  Genitourinary: Prostate normal and penis normal. Rectal exam shows guaiac negative stool.  Musculoskeletal: Normal range of motion. He exhibits no edema or tenderness.  Lymphadenopathy:    He has no cervical adenopathy.  Neurological: He is alert. He has normal reflexes. No cranial nerve deficit. Coordination normal.  Skin: Skin is warm and dry. No rash noted.  Psychiatric: He has a normal mood and affect. His behavior is normal.          Assessment & Plan:   Preventive health exam.  We'll check screening lab Sarcoidosis  Clinically stable.  Will review a chest x-ray Essential hypertension, well-controlled.  We'll continue home blood pressure monitoring Dyslipidemia.  Patient apparently is on simvastatin 80 mg daily.  We'll switch to atorvastatin 80 mg daily.  Review lipid profile Obesity.  Weight loss encouraged  Follow-up one year or as needed  Rogelia BogaKWIATKOWSKI,PETER FRANK

## 2017-06-21 NOTE — Patient Instructions (Signed)
Limit your sodium (Salt) intake  Please check your blood pressure on a regular basis.  If it is consistently greater than 140/90, please make an office appointment.    It is important that you exercise regularly, at least 20 minutes 3 to 4 times per week.  If you develop chest pain or shortness of breath seek  medical attention.  You need to lose weight.  Consider a lower calorie diet and regular exercise.  Return in one year for follow-up    

## 2018-07-01 ENCOUNTER — Encounter: Payer: Self-pay | Admitting: Family Medicine

## 2018-07-01 ENCOUNTER — Ambulatory Visit: Payer: BC Managed Care – PPO | Admitting: Family Medicine

## 2018-07-01 VITALS — BP 102/64 | HR 98 | Temp 98.0°F | Ht 68.0 in | Wt 272.5 lb

## 2018-07-01 DIAGNOSIS — L853 Xerosis cutis: Secondary | ICD-10-CM | POA: Diagnosis not present

## 2018-07-01 MED ORDER — TRIAMCINOLONE ACETONIDE 0.1 % EX CREA: 1.0000 "application " | TOPICAL_CREAM | Freq: Two times a day (BID) | CUTANEOUS | 0 refills | Status: DC

## 2018-07-01 NOTE — Patient Instructions (Signed)
BEFORE YOU LEAVE: -follow up: Make sure you have transfer visit in the next 1-2 months with me or another provider here to establish care  aquaphor twice daily - apply immediately after shower  Take short and cool showers. Use only gentle soap such as dove sensitive or aveeno sensitive. Do not use antibacterial soap.  Use the triamcinolone - a small amount mixed with the aquaphor if needed 1-2 times daily for 2 weeks  Take the zyrtec nightly  I hope you are feeling better soon! Seek care sooner if your symptoms worsen, new concerns arise or you are not improving with treatment.

## 2018-07-01 NOTE — Progress Notes (Signed)
HPI:  Using dictation device. Unfortunately this device frequently misinterprets words/phrases.  Acute visit for pruritis: -started 1 week ago -itchy skin all over -taking zyrtec anyway for allergies -does have dry skin -he was worried about scabies as friend of his daughter had scabies, daughter does not have it  -has dogs, but not new exposure and they are treated for fleas -feels fine otherwise, no malaise, fevers, fash  ROS: See pertinent positives and negatives per HPI.  Past Medical History:  Diagnosis Date  . ERECTILE DYSFUNCTION 08/21/2008  . HYPERTENSION 05/10/2007  . Morbid obesity (HCC)   . Pure hypercholesterolemia 04/24/2008    Past Surgical History:  Procedure Laterality Date  . HIATAL HERNIA REPAIR  07/22/2012   Procedure: HERNIA REPAIR HIATAL;  Surgeon: Atilano Ina, MD,FACS;  Location: [facility];  Service: General;  Laterality: N/A;  . LAPAROSCOPIC GASTRIC BANDING  07/22/2012   Procedure: LAPAROSCOPIC GASTRIC BANDING;  Surgeon: Atilano Ina, MD,FACS;  Location: [facility];  Service: General;  Laterality: N/A;  . WISDOM TOOTH EXTRACTION      Family History  Problem Relation Age of Onset  . Breast cancer Maternal Grandmother   . Prostate cancer Paternal Grandfather     SOCIAL HX: see hpi   Current Outpatient Medications:  .  atorvastatin (LIPITOR) 80 MG tablet, Take 1 tablet (80 mg total) by mouth daily., Disp: 90 tablet, Rfl: 3 .  fluticasone (FLONASE) 50 MCG/ACT nasal spray, USE ONE SPRAY IN EACH NOSTRIL EVERY DAY (Patient taking differently: USE ONE SPRAY IN EACH NOSTRIL AS NEEDED), Disp: 16 g, Rfl: 5 .  lisinopril-hydrochlorothiazide (PRINZIDE,ZESTORETIC) 20-25 MG tablet, Take 1 tablet by mouth daily., Disp: 90 tablet, Rfl: 3 .  loratadine (CLARITIN) 10 MG tablet, Take 10 mg by mouth daily as needed. , Disp: , Rfl:  .  sildenafil (REVATIO) 20 MG tablet, 2-5 tablets  Daily as needed.  For ED, Disp: 90 tablet, Rfl: 4 .  triamcinolone cream (KENALOG) 0.1 %,  Apply 1 application topically 2 (two) times daily., Disp: 30 g, Rfl: 0  EXAM:  Vitals:   07/01/18 0842  BP: 102/64  Pulse: 98  Temp: 98 F (36.7 C)    Body mass index is 41.43 kg/m.  GENERAL: vitals reviewed and listed above, alert, oriented, appears well hydrated and in no acute distress  HEENT: atraumatic, conjunttiva clear, no obvious abnormalities on inspection of external nose and ears  NECK: no obvious masses on inspection  SKIN: dry skin throughout, otherwise no rash or lesions  MS: moves all extremities without noticeable abnormality  PSYCH: pleasant and cooperative, no obvious depression or anxiety  ASSESSMENT AND PLAN:  Discussed the following assessment and plan:  Xerosis of skin  -discussed potential etiologies, suspect dry skin/eczema most likely -good skin regimen and emollient -short course mixed in topical steroid -preventive program -transfer of care visit in the next 1-2 months and recheck (has with Dr. Salomon Fick, but asked if I would take him - let him know either is ok with me) -Patient advised to return or notify a doctor immediately if symptoms worsen or persist or new concerns arise.  Patient Instructions  BEFORE YOU LEAVE: -follow up: Make sure you have transfer visit in the next 1-2 months with me or another provider here to establish care  aquaphor twice daily - apply immediately after shower  Take short and cool showers. Use only gentle soap such as dove sensitive or aveeno sensitive. Do not use antibacterial soap.  Use the triamcinolone - a  small amount mixed with the aquaphor if needed 1-2 times daily for 2 weeks  Take the zyrtec nightly  I hope you are feeling better soon! Seek care sooner if your symptoms worsen, new concerns arise or you are not improving with treatment.      Terressa Koyanagi, DO

## 2018-07-10 ENCOUNTER — Encounter: Payer: Self-pay | Admitting: Family Medicine

## 2018-07-10 ENCOUNTER — Ambulatory Visit: Payer: BC Managed Care – PPO | Admitting: Family Medicine

## 2018-07-10 VITALS — BP 102/70 | HR 96 | Temp 98.7°F | Wt 273.0 lb

## 2018-07-10 DIAGNOSIS — L853 Xerosis cutis: Secondary | ICD-10-CM

## 2018-07-10 DIAGNOSIS — I10 Essential (primary) hypertension: Secondary | ICD-10-CM

## 2018-07-10 DIAGNOSIS — E782 Mixed hyperlipidemia: Secondary | ICD-10-CM | POA: Diagnosis not present

## 2018-07-10 MED ORDER — HYDROXYZINE HCL 25 MG PO TABS: 25.0000 mg | ORAL_TABLET | Freq: Three times a day (TID) | ORAL | 1 refills | Status: DC | PRN

## 2018-07-10 NOTE — Patient Instructions (Addendum)
Managing Your Hypertension Hypertension is commonly called high blood pressure. This is when the force of your blood pressing against the walls of your arteries is too strong. Arteries are blood vessels that carry blood from your heart throughout your body. Hypertension forces the heart to work harder to pump blood, and may cause the arteries to become narrow or stiff. Having untreated or uncontrolled hypertension can cause heart attack, stroke, kidney disease, and other problems. What are blood pressure readings? A blood pressure reading consists of a higher number over a lower number. Ideally, your blood pressure should be below 120/80. The first ("top") number is called the systolic pressure. It is a measure of the pressure in your arteries as your heart beats. The second ("bottom") number is called the diastolic pressure. It is a measure of the pressure in your arteries as the heart relaxes. What does my blood pressure reading mean? Blood pressure is classified into four stages. Based on your blood pressure reading, your health care provider may use the following stages to determine what type of treatment you need, if any. Systolic pressure and diastolic pressure are measured in a unit called mm Hg. Normal  Systolic pressure: below 120.  Diastolic pressure: below 80. Elevated  Systolic pressure: 120-129.  Diastolic pressure: below 80. Hypertension stage 1  Systolic pressure: 130-139.  Diastolic pressure: 80-89. Hypertension stage 2  Systolic pressure: 140 or above.  Diastolic pressure: 90 or above. What health risks are associated with hypertension? Managing your hypertension is an important responsibility. Uncontrolled hypertension can lead to:  A heart attack.  A stroke.  A weakened blood vessel (aneurysm).  Heart failure.  Kidney damage.  Eye damage.  Metabolic syndrome.  Memory and concentration problems.  What changes can I make to manage my  hypertension? Hypertension can be managed by making lifestyle changes and possibly by taking medicines. Your health care provider will help you make a plan to bring your blood pressure within a normal range. Eating and drinking  Eat a diet that is high in fiber and potassium, and low in salt (sodium), added sugar, and fat. An example eating plan is called the DASH (Dietary Approaches to Stop Hypertension) diet. To eat this way: ? Eat plenty of fresh fruits and vegetables. Try to fill half of your plate at each meal with fruits and vegetables. ? Eat whole grains, such as whole wheat pasta, brown rice, or whole grain bread. Fill about one quarter of your plate with whole grains. ? Eat low-fat diary products. ? Avoid fatty cuts of meat, processed or cured meats, and poultry with skin. Fill about one quarter of your plate with lean proteins such as fish, chicken without skin, beans, eggs, and tofu. ? Avoid premade and processed foods. These tend to be higher in sodium, added sugar, and fat.  Reduce your daily sodium intake. Most people with hypertension should eat less than 1,500 mg of sodium a day.  Limit alcohol intake to no more than 1 drink a day for nonpregnant women and 2 drinks a day for men. One drink equals 12 oz of beer, 5 oz of wine, or 1 oz of hard liquor. Lifestyle  Work with your health care provider to maintain a healthy body weight, or to lose weight. Ask what an ideal weight is for you.  Get at least 30 minutes of exercise that causes your heart to beat faster (aerobic exercise) most days of the week. Activities may include walking, swimming, or biking.  Include exercise   to strengthen your muscles (resistance exercise), such as weight lifting, as part of your weekly exercise routine. Try to do these types of exercises for 30 minutes at least 3 days a week.  Do not use any products that contain nicotine or tobacco, such as cigarettes and e-cigarettes. If you need help quitting, ask  your health care provider.  Control any long-term (chronic) conditions you have, such as high cholesterol or diabetes. Monitoring  Monitor your blood pressure at home as told by your health care provider. Your personal target blood pressure may vary depending on your medical conditions, your age, and other factors.  Have your blood pressure checked regularly, as often as told by your health care provider. Working with your health care provider  Review all the medicines you take with your health care provider because there may be side effects or interactions.  Talk with your health care provider about your diet, exercise habits, and other lifestyle factors that may be contributing to hypertension.  Visit your health care provider regularly. Your health care provider can help you create and adjust your plan for managing hypertension. Will I need medicine to control my blood pressure? Your health care provider may prescribe medicine if lifestyle changes are not enough to get your blood pressure under control, and if:  Your systolic blood pressure is 130 or higher.  Your diastolic blood pressure is 80 or higher.  Take medicines only as told by your health care provider. Follow the directions carefully. Blood pressure medicines must be taken as prescribed. The medicine does not work as well when you skip doses. Skipping doses also puts you at risk for problems. Contact a health care provider if:  You think you are having a reaction to medicines you have taken.  You have repeated (recurrent) headaches.  You feel dizzy.  You have swelling in your ankles.  You have trouble with your vision. Get help right away if:  You develop a severe headache or confusion.  You have unusual weakness or numbness, or you feel faint.  You have severe pain in your chest or abdomen.  You vomit repeatedly.  You have trouble breathing. Summary  Hypertension is when the force of blood pumping through  your arteries is too strong. If this condition is not controlled, it may put you at risk for serious complications.  Your personal target blood pressure may vary depending on your medical conditions, your age, and other factors. For most people, a normal blood pressure is less than 120/80.  Hypertension is managed by lifestyle changes, medicines, or both. Lifestyle changes include weight loss, eating a healthy, low-sodium diet, exercising more, and limiting alcohol. This information is not intended to replace advice given to you by your health care provider. Make sure you discuss any questions you have with your health care provider. Document Released: 05/15/2012 Document Revised: 07/19/2016 Document Reviewed: 07/19/2016 Elsevier Interactive Patient Education  2018 [employer].  Atopic Dermatitis Atopic dermatitis is a skin disorder that causes inflammation of the skin. This is the most common type of eczema. Eczema is a group of skin conditions that cause the skin to be itchy, red, and swollen. This condition is generally worse during the cooler winter months and often improves during the warm summer months. Symptoms can vary from person to person. Atopic dermatitis usually starts showing signs in infancy and can last through adulthood. This condition cannot be passed from one person to another (non-contagious), but it is more common in families. Atopic dermatitis  may not always be present. When it is present, it is called a flare-up. What are the causes? The exact cause of this condition is not known. Flare-ups of the condition may be triggered by:  Contact with something that you are sensitive or allergic to.  Stress.  Certain foods.  Extremely hot or cold weather.  Harsh chemicals and soaps.  Dry air.  Chlorine.  What increases the risk? This condition is more likely to develop in people who have a personal history or family history of eczema, allergies, asthma, or hay  fever. What are the signs or symptoms? Symptoms of this condition include:  Dry, scaly skin.  Red, itchy rash.  Itchiness, which can be severe. This may occur before the skin rash. This can make sleeping difficult.  Skin thickening and cracking that can occur over time.  How is this diagnosed? This condition is diagnosed based on your symptoms, a medical history, and a physical exam. How is this treated? There is no cure for this condition, but symptoms can usually be controlled. Treatment focuses on:  Controlling the itchiness and scratching. You may be given medicines, such as antihistamines or steroid creams.  Limiting exposure to things that you are sensitive or allergic to (allergens).  Recognizing situations that cause stress and developing a plan to manage stress.  If your atopic dermatitis does not get better with medicines, or if it is all over your body (widespread), a treatment using a specific type of light (phototherapy) may be used. Follow these instructions at home: Skin care  Keep your skin well-moisturized. Doing this seals in moisture and helps to prevent dryness. ? Use unscented lotions that have petroleum in them. ? Avoid lotions that contain alcohol or water. They can dry the skin.  Keep baths or showers short (less than 5 minutes) in warm water. Do not use hot water. ? Use mild, unscented cleansers for bathing. Avoid soap and bubble bath. ? Apply a moisturizer to your skin right after a bath or shower.  Do not apply anything to your skin without checking with your health care provider. General instructions  Dress in clothes made of cotton or cotton blends. Dress lightly because heat increases itchiness.  When washing your clothes, rinse your clothes twice so all of the soap is removed.  Avoid any triggers that can cause a flare-up.  Try to manage your stress.  Keep your fingernails cut short.  Avoid scratching. Scratching makes the rash and  itchiness worse. It may also result in a skin infection (impetigo) due to a break in the skin caused by scratching.  Take or apply over-the-counter and prescription medicines only as told by your health care provider.  Keep all follow-up visits as told by your health care provider. This is important.  Do not be around people who have cold sores or fever blisters. If you get the infection, it may cause your atopic dermatitis to worsen. Contact a health care provider if:  Your itchiness interferes with sleep.  Your rash gets worse or it is not better within one week of starting treatment.  You have a fever.  You have a rash flare-up after having contact with someone who has cold sores or fever blisters. Get help right away if:  You develop pus or soft yellow scabs in the rash area. Summary  This condition causes a red rash and itchy, dry, scaly skin.  Treatment focuses on controlling the itchiness and scratching, limiting exposure to things that you are  sensitive or allergic to (allergens), recognizing situations that cause stress, and developing a plan to manage stress.  Keep your skin well-moisturized.  Keep baths or showers shorter than 5 minutes and use warm water. Do not use hot water. This information is not intended to replace advice given to you by your health care provider. Make sure you discuss any questions you have with your health care provider. Document Released: 08/18/2000 Document Revised: 09/22/2016 Document Reviewed: 09/22/2016 Elsevier Interactive Patient Education  [employer].

## 2018-07-10 NOTE — Progress Notes (Signed)
Subjective:    Patient ID: Adam Adkins, male    DOB: Aug 23, 1971, 47 y.o.   MRN: [id number]  No chief complaint on file.   HPI Patient was seen today for follow-up on chronic conditions and TOC, previously seen by Dr. Lesia Hausen.  HTN: -taking lisinopril-hydrochlorothiazide 20-25 mg daily -Checking BP at home typically 1teens over 70s or 80s -Eating better.  Drinking plenty of water but does drink 2 to 3 cups of coffee per day. -Pt endorses episode of feeling dizzy/lightheaded.  Has tried to increase p.o. intake of water.  HLD: -Pt taking Lipitor 80 mg daily -denies myalgias -endorses guilty pleasure being fried chicken and chips.  May have fried chicken twice a week.  Itching: - seen 10/28 for same. -Given triamcinolone cream advised likely 2/2 dry skin. -endorses continued itching -States cream helps some -States if it starts is hard to stop it. -denies rash, insect bites, skin lesions. -Patient has tried Zyrtec, Claritin, Xyzal, Aveeno soap, changed laundry detergent to sensitive skin dye free version.    Past Medical History:  Diagnosis Date  . ERECTILE DYSFUNCTION 08/21/2008  . HYPERTENSION 05/10/2007  . Morbid obesity (HCC)   . Pure hypercholesterolemia 04/24/2008    No Known Allergies  ROS General: Denies fever, chills, night sweats, changes in weight, changes in appetite HEENT: Denies headaches, ear pain, changes in vision, rhinorrhea, sore throat CV: Denies CP, palpitations, SOB, orthopnea Pulm: Denies SOB, cough, wheezing GI: Denies abdominal pain, nausea, vomiting, diarrhea, constipation GU: Denies dysuria, hematuria, frequency, vaginal discharge Msk: Denies muscle cramps, joint pains Neuro: Denies weakness, numbness, tingling Skin: Denies rashes, bruising  + pruritus Psych: Denies depression, anxiety, hallucinations    Objective:    Blood pressure 102/70, pulse 96, temperature 98.7 F (37.1 C), temperature source Oral, weight 273 lb (123.8 kg), SpO2  96 %.  Gen. Pleasant, well-nourished, in no distress, normal affect   Lungs: no accessory muscle use, CTAB, no wheezes or rales Cardiovascular: RRR, no m/r/g, no peripheral edema Neuro:  A&Ox3, CN II-XII intact, normal gait Skin:  Warm, dry, intact, no lesions/ rash.  Tattoos on bilateral forearms.  Wt Readings from Last 3 Encounters:  07/01/18 272 lb 8 oz (123.6 kg)  06/21/17 253 lb (114.8 kg)  05/15/16 256 lb 8 oz (116.3 kg)    Lab Results  Component Value Date   WBC 5.0 06/21/2017   HGB 14.5 06/21/2017   HCT 43.3 06/21/2017   PLT 238.0 06/21/2017   GLUCOSE 93 06/21/2017   CHOL 212 (H) 06/21/2017   TRIG 140.0 06/21/2017   HDL 36.40 (L) 06/21/2017   LDLDIRECT 170.0 09/13/2009   LDLCALC 148 (H) 06/21/2017   ALT 19 06/21/2017   AST 20 06/21/2017   NA 137 06/21/2017   K 3.8 06/21/2017   CL 99 06/21/2017   CREATININE 1.07 06/21/2017   BUN 16 06/21/2017   CO2 31 06/21/2017   TSH 0.71 06/21/2017   PSA 0.36 06/21/2017    Assessment/Plan:  Essential hypertension -Well controlled -Continue lisinopril-hydrochlorothiazide 20-12.5 mg daily -At next OFV consider decreasing BP medication -Continue lifestyle modifications -Patient encouraged to stay hydrated -Continue checking BP at home and keep a log readings  Mixed hyperlipidemia -Continue Lipitor 80 mg daily -Patient advised to cut down on the amount of fried chicken and chips patient is eating per week -Continue lifestyle modifications including exercise  Xerosis of skin -Continue triamcinolone cream and Aveeno or Eucerin. -Continue lukewarm showers and applying moisturizer immediately after showering -Given handout -If itching continues discussed  possible referral to allergy - Plan: hydrOXYzine (ATARAX/VISTARIL) 25 MG tablet  Follow-up PRN in the next month or so for continued symptoms.  Abbe Amsterdam, MD

## 2018-08-13 ENCOUNTER — Other Ambulatory Visit: Payer: Self-pay | Admitting: Family Medicine

## 2018-08-14 ENCOUNTER — Telehealth: Payer: Self-pay | Admitting: Family Medicine

## 2018-08-14 NOTE — Telephone Encounter (Signed)
LOV on 07/10/18 with Dr. Salomon FickBanks. Pt requesting refills on prescriptions below previously written by Dr. Lesia HausenKwiatowski.

## 2018-08-14 NOTE — Telephone Encounter (Signed)
Copied from CRM [phone]. Topic: Quick Communication - Rx Refill/Question >> Aug 14, 2018  3:22 PM Angela NevinWilliams, Candice N wrote: Medication: simvastatin (ZOCOR) 40 MG tablet and sildenafil (REVATIO) 20 MG tablet   Both of these medications were prescribed by Dr. Kirtland BouchardK. Patient is requesting these be refilled by Dr. Salomon FickBanks. Please advise.   Has the patient contacted their pharmacy? Yes, Patient was advised to contact office.  Preferred Pharmacy (with phone number or street name): [facility] [address] [place], [place] - [id number] [phone] (Phone) [phone] (Fax)

## 2018-08-19 ENCOUNTER — Other Ambulatory Visit: Payer: Self-pay

## 2018-08-19 MED ORDER — ATORVASTATIN CALCIUM 80 MG PO TABS: 80.0000 mg | ORAL_TABLET | Freq: Every day | ORAL | 3 refills | Status: DC

## 2018-08-19 MED ORDER — SILDENAFIL CITRATE 20 MG PO TABS: ORAL_TABLET | ORAL | 4 refills | Status: DC

## 2018-08-19 NOTE — Telephone Encounter (Signed)
Rx refill sent to his pharmacy 

## 2018-09-20 ENCOUNTER — Other Ambulatory Visit: Payer: Self-pay | Admitting: Internal Medicine

## 2018-09-25 ENCOUNTER — Encounter: Payer: Self-pay | Admitting: Internal Medicine

## 2018-09-25 NOTE — Telephone Encounter (Signed)
Pt calling to check status. Pt is completely out  °

## 2018-11-19 ENCOUNTER — Other Ambulatory Visit: Payer: Self-pay | Admitting: Family Medicine

## 2019-03-11 ENCOUNTER — Other Ambulatory Visit: Payer: Self-pay | Admitting: Family Medicine

## 2019-05-31 ENCOUNTER — Other Ambulatory Visit: Payer: Self-pay | Admitting: Family Medicine

## 2019-06-02 NOTE — Telephone Encounter (Signed)
Pt needs appointment for further refills 

## 2019-08-07 ENCOUNTER — Other Ambulatory Visit: Payer: Self-pay | Admitting: Family Medicine

## 2019-08-08 NOTE — Telephone Encounter (Signed)
Pt needs appointment for further refills 

## 2019-08-16 ENCOUNTER — Other Ambulatory Visit: Payer: Self-pay

## 2019-08-16 DIAGNOSIS — Z20822 Contact with and (suspected) exposure to covid-19: Secondary | ICD-10-CM

## 2019-08-17 ENCOUNTER — Telehealth: Payer: Self-pay | Admitting: Critical Care Medicine

## 2019-08-17 LAB — NOVEL CORONAVIRUS, NAA: SARS-CoV-2, NAA: DETECTED — AB

## 2019-08-17 NOTE — Telephone Encounter (Signed)
I connected with this patient who is positive Covid from December 12 event.  He is having no symptoms.  He knows he needs to stay in isolation till December 23.  He is not a monoclonal antibody candidate.  He will follow-up with his primary care provider

## 2019-08-17 NOTE — Telephone Encounter (Signed)
-----   Message from Sarah E Ellington, RN sent at 08/17/2019  4:18 PM EST -----  ----- Message ----- From: Interface, [facility] Lab Results In Sent: 08/17/2019   2:36 PM EST To: Mobile Screening Testing Result Pool   

## 2019-09-05 ENCOUNTER — Other Ambulatory Visit: Payer: Self-pay | Admitting: Family Medicine

## 2019-09-08 NOTE — Telephone Encounter (Signed)
Pt has a virtual visit with Dr Salomon Fick tomorrow at 1.30 pm for f/u and med refill

## 2019-09-09 ENCOUNTER — Other Ambulatory Visit: Payer: Self-pay

## 2019-09-09 ENCOUNTER — Telehealth (INDEPENDENT_AMBULATORY_CARE_PROVIDER_SITE_OTHER): Payer: BC Managed Care – PPO | Admitting: Family Medicine

## 2019-09-09 DIAGNOSIS — Z8616 Personal history of COVID-19: Secondary | ICD-10-CM | POA: Diagnosis not present

## 2019-09-09 DIAGNOSIS — L853 Xerosis cutis: Secondary | ICD-10-CM

## 2019-09-09 DIAGNOSIS — E782 Mixed hyperlipidemia: Secondary | ICD-10-CM

## 2019-09-09 DIAGNOSIS — I10 Essential (primary) hypertension: Secondary | ICD-10-CM

## 2019-09-09 MED ORDER — TRIAMCINOLONE ACETONIDE 0.1 % EX OINT: 1.0000 "application " | TOPICAL_OINTMENT | Freq: Two times a day (BID) | CUTANEOUS | 1 refills | Status: AC

## 2019-09-09 NOTE — Progress Notes (Signed)
Virtual Visit via Video Note  I connected with Adam Adkins on 09/09/19 at  1:30 PM EST by a video enabled telemedicine application 2/2 COVID-19 pandemic and verified that I am speaking with the correct person using two identifiers.  Location patient: home Location provider:work or home office Persons participating in the virtual visit: patient, provider  I discussed the limitations of evaluation and management by telemedicine and the availability of in person appointments. The patient expressed understanding and agreed to proceed.   HPI: Pt is a 49 yo male with pmh sig for HTN, HLD, recently recovered from COVID-19.  Dx'd 08/16/19.  Pt states he only had chills, but no other symptoms.  He completed a 10 day quarantine.  Pt was concerned his family would get sick, but they remained healthy.  Pt inquires about getting the COVID-19 vaccine when available and an influenza vaccine.  Pt has never had an influenza vaccine.  Pt otherwise doing ok.  Pt needs refills on the lisinopril, simvastatin, and kenalog.  Has some dry skin/itchy areas on hands and elbows.  Prefers the kenalog ointment to the cream.  Frequent handwashing and hand sanitizer use making hands drier.  Switched to nitrile gloves to see if that would decrease pruritis.  Checks bp occasionally, typically 111-123/60-70s.  Notices slight increase 120s/80s while sick.  Denies HAs, changes in vision, CP.   ROS: See pertinent positives and negatives per HPI.  Past Medical History:  Diagnosis Date  . ERECTILE DYSFUNCTION 08/21/2008  . HYPERTENSION 05/10/2007  . Morbid obesity (HCC)   . Pure hypercholesterolemia 04/24/2008    Past Surgical History:  Procedure Laterality Date  . HIATAL HERNIA REPAIR  07/22/2012   Procedure: HERNIA REPAIR HIATAL;  Surgeon: Atilano Ina, MD,FACS;  Location: [facility];  Service: General;  Laterality: N/A;  . LAPAROSCOPIC GASTRIC BANDING  07/22/2012   Procedure: LAPAROSCOPIC GASTRIC BANDING;  Surgeon: Atilano Ina, MD,FACS;  Location: [facility];  Service: General;  Laterality: N/A;  . WISDOM TOOTH EXTRACTION      Family History  Problem Relation Age of Onset  . Breast cancer Maternal Grandmother   . 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    Current Outpatient Medications:  .  atorvastatin (LIPITOR) 80 MG tablet, Take 1 tablet (80 mg total) by mouth daily., Disp: 90 tablet, Rfl: 3 .  fluticasone (FLONASE) 50 MCG/ACT nasal spray, USE ONE SPRAY IN EACH NOSTRIL EVERY DAY (Patient taking differently: USE ONE SPRAY IN EACH NOSTRIL AS NEEDED), Disp: 16 g, Rfl: 5 .  hydrOXYzine (ATARAX/VISTARIL) 25 MG tablet, Take 1 tablet (25 mg total) by mouth 3 (three) times daily as needed for itching., Disp: 60 tablet, Rfl: 1 .  lisinopril-hydrochlorothiazide (ZESTORETIC) 20-25 MG tablet, Take 1 tablet by mouth once daily, Disp: 30 tablet, Rfl: 0 .  loratadine (CLARITIN) 10 MG tablet, Take 10 mg by mouth daily as needed. , Disp: , Rfl:  .  sildenafil (REVATIO) 20 MG tablet, 2-5 tablets  Daily as needed.  For ED, Disp: 90 tablet, Rfl: 4 .  triamcinolone cream (KENALOG) 0.1 %, APPLY CREAM EXTERNALLY TWICE DAILY, Disp: 80 g, Rfl: 0  EXAM:  VITALS per patient if applicable: RR between 12-20 bpm  GENERAL: alert, oriented, appears well and in no acute distress  HEENT: atraumatic, conjunctiva clear, no obvious abnormalities on inspection of external nose and ears  NECK: normal movements of the head and neck  LUNGS: on inspection no signs  of respiratory distress, breathing rate appears normal, no obvious gross SOB, gasping or wheezing  CV: no obvious cyanosis  MS: moves all visible extremities without noticeable abnormality  PSYCH/NEURO: pleasant and cooperative, no obvious depression or anxiety, speech and thought processing grossly intact  ASSESSMENT AND PLAN:  Discussed the following assessment and plan:  Essential hypertension -controlled -continue  lisinopril-HCTZ 20-25 mg daily -rx sent to pharmacy via prior rx request -continue lifestyle modifications  Mixed hyperlipidemia -continue lipitor 80 mg daily -continue lifestyle modifications -rx sent to pharmacy via prior rx request.  Xerosis of skin  - Plan: triamcinolone ointment (KENALOG) 0.1 %  H/o COVID-19 infection -recovering -education given -Pt encouraged to consider receiving influenza vaccine and when available the COVID-19 vaccine. -given precautions  F/u next month schedule permitting for labs (BMP, lipid panel, etc)   I discussed the assessment and treatment plan with the patient. The patient was provided an opportunity to ask questions and all were answered. The patient agreed with the plan and demonstrated an understanding of the instructions.   The patient was advised to call back or seek an in-person evaluation if the symptoms worsen or if the condition fails to improve as anticipated.  Adam Ruddy, MD

## 2019-10-27 ENCOUNTER — Ambulatory Visit: Payer: BC Managed Care – PPO | Attending: Family

## 2019-10-27 DIAGNOSIS — Z23 Encounter for immunization: Secondary | ICD-10-CM

## 2019-10-27 NOTE — Progress Notes (Signed)
   Covid-19 Vaccination Clinic  Name:  Adam Adkins    MRN: [id number] DOB: 07-14-1971  10/27/2019  Mr. Adam Adkins was observed post Covid-19 immunization for 15 minutes without incidence. He was provided with Vaccine Information Sheet and instruction to access the V-Safe system.   Mr. Adam Adkins was instructed to call 911 with any severe reactions post vaccine: Marland Kitchen Difficulty breathing  . Swelling of your face and throat  . A fast heartbeat  . A bad rash all over your body  . Dizziness and weakness    Immunizations Administered    Name Date Dose VIS Date Route   Moderna COVID-19 Vaccine 10/27/2019  1:04 PM 0.5 mL 08/05/2019 Intramuscular   Manufacturer: Moderna   Lot: [id number]   NDC: 03979-536-92

## 2019-11-25 ENCOUNTER — Ambulatory Visit: Payer: BC Managed Care – PPO | Attending: Family

## 2019-11-25 DIAGNOSIS — Z23 Encounter for immunization: Secondary | ICD-10-CM

## 2019-11-25 NOTE — Progress Notes (Signed)
   Covid-19 Vaccination Clinic  Name:  Adam Adkins    MRN: [id number] DOB: [birth year]  11/25/2019  Adam Adkins was observed post Covid-19 immunization for 15 minutes without incident. He was provided with Vaccine Information Sheet and instruction to access the V-Safe system.   Adam Adkins was instructed to call 911 with any severe reactions post vaccine: Marland Kitchen Difficulty breathing  . Swelling of face and throat  . A fast heartbeat  . A bad rash all over body  . Dizziness and weakness   Immunizations Administered    Name Date Dose VIS Date Route   Moderna COVID-19 Vaccine 11/25/2019  5:07 PM 0.5 mL 08/05/2019 Intramuscular   Manufacturer: Moderna   Lot: [id number]   NDC: 36629-476-54

## 2019-12-02 ENCOUNTER — Ambulatory Visit: Payer: BC Managed Care – PPO

## 2020-05-17 ENCOUNTER — Other Ambulatory Visit: Payer: Self-pay

## 2020-05-17 ENCOUNTER — Encounter (HOSPITAL_COMMUNITY): Payer: Self-pay | Admitting: Emergency Medicine

## 2020-05-17 ENCOUNTER — Ambulatory Visit (HOSPITAL_COMMUNITY)
Admission: EM | Admit: 2020-05-17 | Discharge: 2020-05-17 | Disposition: A | Discharge status: Home / Self Care | Payer: BC Managed Care – PPO | Attending: Emergency Medicine | Admitting: Emergency Medicine

## 2020-05-17 DIAGNOSIS — S61209A Unspecified open wound of unspecified finger without damage to nail, initial encounter: Secondary | ICD-10-CM

## 2020-05-17 MED ORDER — TETANUS-DIPHTH-ACELL PERTUSSIS 5-2.5-18.5 LF-MCG/0.5 IM SUSP
0.5000 mL | Freq: Once | INTRAMUSCULAR | Status: AC
  Administered 2020-05-17: 0.5 mL via INTRAMUSCULAR

## 2020-05-17 MED ORDER — TETANUS-DIPHTH-ACELL PERTUSSIS 5-2.5-18.5 LF-MCG/0.5 IM SUSP
INTRAMUSCULAR | Status: AC
  Filled 2020-05-17: qty 0.5

## 2020-05-17 NOTE — ED Provider Notes (Signed)
[facility]    CSN: [id number] Arrival date & time: 05/17/20  0802      History   Chief Complaint Chief Complaint  Patient presents with  . Hand Pain    HPI Adam Adkins is a 49 y.o. male.   Adam Adkins presents with complaints of avulsion to right hand ring finger after he accidentally sliced it on a food prep slicer last night. He did cleanse it after. Still with oozing. Unknown last tdap. Minimal pain.    ROS per HPI, negative if not otherwise mentioned.      Past Medical History:  Diagnosis Date  . ERECTILE DYSFUNCTION 08/21/2008  . HYPERTENSION 05/10/2007  . Morbid obesity (HCC)   . Pure hypercholesterolemia 04/24/2008    Patient Active Problem List   Diagnosis Date Noted  . Sarcoidosis 05/15/2016  . Retroperitoneal lymphadenopathy 02/08/2015  . Pulmonary nodules/lesions, multiple 02/08/2015  . 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 Obesity, Class III, BMI 40-49.9 (morbid obesity) (HCC) 05/02/2012  . ERECTILE DYSFUNCTION 08/21/2008  . PURE HYPERCHOLESTEROLEMIA 04/24/2008  . Essential hypertension 05/10/2007    Past Surgical History:  Procedure Laterality Date  . HIATAL HERNIA REPAIR  07/22/2012   Procedure: HERNIA REPAIR HIATAL;  Surgeon: Atilano Ina, MD,FACS;  Location: [facility];  Service: General;  Laterality: N/A;  . LAPAROSCOPIC GASTRIC BANDING  07/22/2012   Procedure: LAPAROSCOPIC GASTRIC BANDING;  Surgeon: Atilano Ina, MD,FACS;  Location: [facility];  Service: General;  Laterality: N/A;  . WISDOM TOOTH EXTRACTION         Home Medications    Prior to Admission medications   Medication Sig Start Date End Date Taking? Authorizing Provider  atorvastatin (LIPITOR) 80 MG tablet Take 1 tablet by mouth once daily 09/09/19   Deeann Saint, MD  fluticasone (FLONASE) 50 MCG/ACT nasal spray USE ONE SPRAY IN EACH NOSTRIL EVERY DAY Patient taking differently: USE ONE SPRAY IN EACH NOSTRIL AS NEEDED 03/23/14    Gordy Savers, MD  hydrOXYzine (ATARAX/VISTARIL) 25 MG tablet Take 1 tablet (25 mg total) by mouth 3 (three) times daily as needed for itching. 07/10/18   Deeann Saint, MD  lisinopril-hydrochlorothiazide (ZESTORETIC) 20-25 MG tablet Take 1 tablet by mouth once daily 09/09/19   Deeann Saint, MD  loratadine (CLARITIN) 10 MG tablet Take 10 mg by mouth daily as needed.     [provider]  sildenafil (REVATIO) 20 MG tablet 2-5 tablets  Daily as needed.  For ED 08/19/18   Deeann Saint, MD  triamcinolone ointment (KENALOG) 0.1 % Apply 1 application topically 2 (two) times daily. 09/09/19   Deeann Saint, MD    Family History Family History  Problem Relation Age of Onset  . Breast cancer Maternal Grandmother   . Prostate cancer Paternal Grandfather     Social History Social History   Tobacco Use  . Smoking status: Former Smoker    Packs/day: 0.30    Types: Cigarettes    Quit date: 09/04/1998    Years since quitting: 21.7  . Smokeless tobacco: Never Used  Substance Use Topics  . Alcohol use: No  . Drug use: No     Allergies   Patient has no known allergies.   Review of Systems Review of Systems   Physical Exam Triage Vital Signs ED Triage Vitals  Enc Vitals Group     BP 05/17/20 0814 131/84     Pulse Rate 05/17/20 0814 100  Resp 05/17/20 0814 18     Temp 05/17/20 0814 98.8 F (37.1 C)     Temp Source 05/17/20 0814 Oral     SpO2 05/17/20 0814 100 %     Weight --      Height --      Head Circumference --      Peak Flow --      Pain Score 05/17/20 0813 0     Pain Loc --      Pain Edu? --      Excl. in GC? --    No data found.  Updated Vital Signs BP 131/84 (BP Location: Right Arm)   Pulse 100   Temp 98.8 F (37.1 C) (Oral)   Resp 18   SpO2 100%    Physical Exam Constitutional:      Appearance: He is well-developed.  Cardiovascular:     Rate and Rhythm: Normal rate.  Pulmonary:     Effort: Pulmonary effort is normal.    Musculoskeletal:     Right hand: Laceration present. No swelling, tenderness or bony tenderness. Normal range of motion. Normal strength. Normal sensation. Normal capillary refill. Normal pulse.     Comments: Avulsion to dorsal aspect of distal right ring finger. Involves distal tip of finger nail. Oozing blood. cap refill < 2 seconds    Skin:    General: Skin is warm and dry.  Neurological:     Mental Status: He is alert and oriented to person, place, and time.    Brief tourniquet placed and pressure/ elevation of finger with wound glue applied, stopping oozing of blood. Cap refill <2 seconds and neurovascularly intact.    UC Treatments / Results  Labs (all labs ordered are listed, but only abnormal results are displayed) Labs Reviewed - No data to display  EKG   Radiology No results found.  Procedures Procedures (including critical care time)  Medications Ordered in UC Medications  Tdap (BOOSTRIX) injection 0.5 mL (0.5 mLs Intramuscular Given 05/17/20 0844)    Initial Impression / Assessment and Plan / UC Course  I have reviewed the triage vital signs and the nursing notes.  Pertinent labs & imaging results that were available during my care of the patient were reviewed by me and considered in my medical decision making (see chart for details).     Wound glue applied to avulsion to stop oozing and provide protection. tdap updated. Expected course of healing discussed. Return precautions provided. Patient verbalized understanding and agreeable to plan.   Final Clinical Impressions(s) / UC Diagnoses   Final diagnoses:  Avulsion of finger, initial encounter     Discharge Instructions     Leave glue on as long as it will remain- ideally the next 48-72 hours at least.  It will eventually peel off.  Once it is off just cleanse the wound with soap and water daily and keep covered to keep clean and protected.  Return for any signs of infection- redness, warmth, swelling,  or pus drainage    ED Prescriptions    None     PDMP not reviewed this encounter.   Georgetta Haber, NP 05/17/20 1000

## 2020-05-17 NOTE — ED Triage Notes (Signed)
Pt presents with finger laceration after cutting finger last night on slicer.

## 2020-05-17 NOTE — Discharge Instructions (Signed)
Leave glue on as long as it will remain- ideally the next 48-72 hours at least.  It will eventually peel off.  Once it is off just cleanse the wound with soap and water daily and keep covered to keep clean and protected.  Return for any signs of infection- redness, warmth, swelling, or pus drainage

## 2020-06-10 ENCOUNTER — Ambulatory Visit: Payer: BC Managed Care – PPO | Attending: Family

## 2020-06-10 DIAGNOSIS — Z23 Encounter for immunization: Secondary | ICD-10-CM

## 2020-07-18 NOTE — Progress Notes (Addendum)
   Covid-19 Vaccination Clinic  Name:  Adam Adkins    MRN: [id number] DOB: Sep 21, 1970  07/18/2020  [name] was observed post Covid-19 immunization for 15 minutes without incident. He was provided with Vaccine Information Sheet and instruction to access the V-Safe system.   [name] was instructed to call 911 with any severe reactions post vaccine: Marland Kitchen Difficulty breathing  . Swelling of face and throat  . A fast heartbeat  . A bad rash all over body  . Dizziness and weakness   Immunizations Administered    No immunizations on file.      Marland Kitchen

## 2020-07-18 NOTE — Progress Notes (Incomplete)
   Covid-19 Vaccination Clinic  Name:  Adam Adkins    MRN: [id number] DOB: 1970/12/05  07/18/2020  Mr. Vonbehren was observed post Covid-19 immunization for {COVID Vaccine Observation Times:23551} without incident. He was provided with Vaccine Information Sheet and instruction to access the V-Safe system.   Mr. Befort was instructed to call 911 with any severe reactions post vaccine: Marland Kitchen Difficulty breathing  . Swelling of face and throat  . A fast heartbeat  . A bad rash all over body  . Dizziness and weakness   Immunizations Administered    No immunizations on file.

## 2020-09-13 ENCOUNTER — Other Ambulatory Visit: Payer: BC Managed Care – PPO

## 2020-09-26 ENCOUNTER — Other Ambulatory Visit: Payer: Self-pay | Admitting: Family Medicine

## 2020-09-27 NOTE — Telephone Encounter (Signed)
Pt needs appointment for further refills 

## 2020-10-06 ENCOUNTER — Telehealth (INDEPENDENT_AMBULATORY_CARE_PROVIDER_SITE_OTHER): Payer: Self-pay | Admitting: Family Medicine

## 2020-10-06 ENCOUNTER — Encounter: Payer: Self-pay | Admitting: Family Medicine

## 2020-10-06 DIAGNOSIS — I10 Essential (primary) hypertension: Secondary | ICD-10-CM

## 2020-10-06 DIAGNOSIS — Z1211 Encounter for screening for malignant neoplasm of colon: Secondary | ICD-10-CM

## 2020-10-06 NOTE — Progress Notes (Signed)
Virtual Visit via Video Note  I connected with Adam Adkins on 10/06/20 at  4:00 PM EST by a video enabled telemedicine application 2/2 COVID-19 pandemic and verified that I am speaking with the correct person using two identifiers.  Location patient: home Location provider:work or home office Persons participating in the virtual visit: patient, provider  I discussed the limitations of evaluation and management by telemedicine and the availability of in person appointments. The patient expressed understanding and agreed to proceed.   HPI: Pt is a 50 yo male with pmh sig for HTN, ED, HLD who is seen for f/u.  Pt checking bp at home.  Readings have been low at times 98/60 otherwise in 110s/70s.  Pt had a few episodes of dizziness while up doing activities.  Pt notes having to clear his throat in the am.     Notes sciatica improving.  Doing stretching, yoga, and PT which help.  Using a stand up desk.  Pain is a 2/10.    Patient has yet to have a colonoscopy to screen for colon cancer.  ROS: See pertinent positives and negatives per HPI.  Past Medical History:  Diagnosis Date  . ERECTILE DYSFUNCTION 08/21/2008  . HYPERTENSION 05/10/2007  . Morbid obesity (HCC)   . Pure hypercholesterolemia 04/24/2008    Past Surgical History:  Procedure Laterality Date  . HIATAL HERNIA REPAIR  07/22/2012   Procedure: HERNIA REPAIR HIATAL;  Surgeon: Atilano Ina, MD,FACS;  Location: [facility];  Service: General;  Laterality: N/A;  . LAPAROSCOPIC GASTRIC BANDING  07/22/2012   Procedure: LAPAROSCOPIC GASTRIC BANDING;  Surgeon: Atilano Ina, MD,FACS;  Location: [facility];  Service: General;  Laterality: N/A;  . WISDOM TOOTH EXTRACTION      Family History  Problem Relation Age of Onset  . Breast cancer Maternal Grandmother   . Prostate cancer Paternal Grandfather      Current Outpatient Medications:  .  atorvastatin (LIPITOR) 80 MG tablet, Take 1 tablet by mouth once daily, Disp: 90 tablet, Rfl: 0 .   fluticasone (FLONASE) 50 MCG/ACT nasal spray, USE ONE SPRAY IN EACH NOSTRIL EVERY DAY (Patient taking differently: USE ONE SPRAY IN EACH NOSTRIL AS NEEDED), Disp: 16 g, Rfl: 5 .  hydrOXYzine (ATARAX/VISTARIL) 25 MG tablet, Take 1 tablet (25 mg total) by mouth 3 (three) times daily as needed for itching., Disp: 60 tablet, Rfl: 1 .  lisinopril-hydrochlorothiazide (ZESTORETIC) 20-25 MG tablet, Take 1 tablet by mouth once daily, Disp: 90 tablet, Rfl: 0 .  loratadine (CLARITIN) 10 MG tablet, Take 10 mg by mouth daily as needed. , Disp: , Rfl:  .  sildenafil (REVATIO) 20 MG tablet, 2-5 tablets  Daily as needed.  For ED, Disp: 90 tablet, Rfl: 4 .  triamcinolone ointment (KENALOG) 0.1 %, Apply 1 application topically 2 (two) times daily., Disp: 453.6 g, Rfl: 1  EXAM:  VITALS per patient if applicable: RR between 12-20 bpm  GENERAL: alert, oriented, appears well and in no acute distress  HEENT: atraumatic, conjunctiva clear, no obvious abnormalities on inspection of external nose and ears  NECK: normal movements of the head and neck  LUNGS: on inspection no signs of respiratory distress, breathing rate appears normal, no obvious gross SOB, gasping or wheezing  CV: no obvious cyanosis  MS: moves all visible extremities without noticeable abnormality  PSYCH/NEURO: pleasant and cooperative, no obvious depression or anxiety, speech and thought processing grossly intact  ASSESSMENT AND PLAN:  Discussed the following assessment and plan:  Essential hypertension -  controlled -will start taking 1/2 tab lisinopril-HCTZ 20-25 mg daily -Increase p.o. intake of water and fluids -continue checking bp   Screen for colon cancer  - Plan: Ambulatory referral to Gastroenterology  F/u in the next few wks for CPE.   I discussed the assessment and treatment plan with the patient. The patient was provided an opportunity to ask questions and all were answered. The patient agreed with the plan and demonstrated  an understanding of the instructions.   The patient was advised to call back or seek an in-person evaluation if the symptoms worsen or if the condition fails to improve as anticipated.   Deeann Saint, MD

## 2020-10-11 ENCOUNTER — Other Ambulatory Visit: Payer: Self-pay

## 2020-10-11 ENCOUNTER — Emergency Department (HOSPITAL_COMMUNITY): Payer: Self-pay

## 2020-10-11 ENCOUNTER — Encounter (HOSPITAL_COMMUNITY): Payer: Self-pay | Admitting: *Deleted

## 2020-10-11 ENCOUNTER — Emergency Department (HOSPITAL_COMMUNITY)
Admission: EM | Admit: 2020-10-11 | Discharge: 2020-10-11 | Disposition: A | Discharge status: Home / Self Care | Payer: Self-pay | Attending: Emergency Medicine | Admitting: Emergency Medicine

## 2020-10-11 DIAGNOSIS — Z5321 Procedure and treatment not carried out due to patient leaving prior to being seen by health care provider: Secondary | ICD-10-CM | POA: Insufficient documentation

## 2020-10-11 DIAGNOSIS — R0789 Other chest pain: Secondary | ICD-10-CM | POA: Insufficient documentation

## 2020-10-11 LAB — CBC
HCT: 45.9 % (ref 39.0–52.0)
Hemoglobin: 15.3 g/dL (ref 13.0–17.0)
MCH: 30.8 pg (ref 26.0–34.0)
MCHC: 33.3 g/dL (ref 30.0–36.0)
MCV: 92.5 fL (ref 80.0–100.0)
Platelets: 245 10*3/uL (ref 150–400)
RBC: 4.96 MIL/uL (ref 4.22–5.81)
RDW: 12.5 % (ref 11.5–15.5)
WBC: 7.5 10*3/uL (ref 4.0–10.5)
nRBC: 0 % (ref 0.0–0.2)

## 2020-10-11 LAB — BASIC METABOLIC PANEL
Anion gap: 13 (ref 5–15)
BUN: 13 mg/dL (ref 6–20)
CO2: 25 mmol/L (ref 22–32)
Calcium: 9.6 mg/dL (ref 8.9–10.3)
Chloride: 98 mmol/L (ref 98–111)
Creatinine, Ser: 1.15 mg/dL (ref 0.61–1.24)
GFR, Estimated: >60 mL/min (ref >60)
Glucose, Bld: 104 mg/dL — ABNORMAL HIGH (ref 70–99)
Potassium: 4.3 mmol/L (ref 3.5–5.1)
Sodium: 136 mmol/L (ref 135–145)

## 2020-10-11 LAB — TROPONIN I (HIGH SENSITIVITY): Troponin I (High Sensitivity): 7 ng/L (ref <18)

## 2020-10-11 NOTE — ED Triage Notes (Signed)
C/o chest discomfort yest states he was able to sleep last pm however upon waking today his still had the discomfort in his chest non -radiating denies sob

## 2020-10-25 ENCOUNTER — Encounter: Payer: Self-pay | Admitting: Family Medicine

## 2020-10-26 ENCOUNTER — Encounter: Payer: Self-pay | Admitting: Internal Medicine

## 2020-12-08 ENCOUNTER — Telehealth: Payer: Self-pay | Admitting: *Deleted

## 2020-12-08 NOTE — Telephone Encounter (Signed)
Ok for procedure  At [facility] please inquire if he has had any further episodes of chest pain and if so let me know (including dyspnea, exertional symptoms).  If not, ok to proceed

## 2020-12-08 NOTE — Telephone Encounter (Signed)
noted 

## 2020-12-08 NOTE — Telephone Encounter (Signed)
Dr. Rhea Belton,  This pt is coming for a PV on 12-16-20- his screening colonoscopy is 12-30-20.  While getting his chart ready, I noted he was seen in the ED on 10-11-20 for chest pain. I spoke with him- he states he never had any episodes of chest pain before or after this ED visit.  He states he was not told he needed to follow up with his PCP or a cardiologist. He is having no current issues.  Is he for direct to [facility]? Please advise, Baxter Hire

## 2020-12-16 ENCOUNTER — Ambulatory Visit (AMBULATORY_SURGERY_CENTER): Payer: Self-pay

## 2020-12-16 ENCOUNTER — Other Ambulatory Visit: Payer: Self-pay

## 2020-12-16 VITALS — Ht 68.0 in | Wt 274.0 lb

## 2020-12-16 DIAGNOSIS — Z1211 Encounter for screening for malignant neoplasm of colon: Secondary | ICD-10-CM

## 2020-12-16 MED ORDER — SUTAB 1479-225-188 MG PO TABS: 12.0000 | ORAL_TABLET | ORAL | 0 refills | Status: DC

## 2020-12-16 NOTE — Progress Notes (Signed)
No egg or soy allergy known to patient  No issues with past sedation with any surgeries or procedures Patient denies ever being told they had issues or difficulty with intubation  No FH of Malignant Hyperthermia No diet pills per patient No home 02 use per patient  No blood thinners per patient  Pt denies issues with constipation  No A fib or A flutter  EMMI video to pt or via MyChart  COVID 19 guidelines implemented in PV today with Pt and RN  Pt is fully vaccinated  for Covid    Pt states "chest pain" from February was decided to be a reflux issue due to GI distress from stress/ problems and family issues. States his PCP is aware, pt aware to call us if any cp or DOE or other illness from now until day of procedure.  Sutab Coupon given to pt in PV today , Code to Pharmacy and  NO PA's for preps discussed with pt In PV today  Discussed with pt there will be an out-of-pocket cost for prep and that varies from $0 to 70 dollars   Due to the COVID-19 pandemic we are asking patients to follow certain guidelines.  Pt aware of COVID protocols and [facility] guidelines

## 2020-12-28 ENCOUNTER — Encounter: Payer: Self-pay | Admitting: Internal Medicine

## 2020-12-30 ENCOUNTER — Other Ambulatory Visit: Payer: Self-pay

## 2020-12-30 ENCOUNTER — Encounter: Payer: Self-pay | Admitting: Internal Medicine

## 2020-12-30 ENCOUNTER — Ambulatory Visit (AMBULATORY_SURGERY_CENTER): Payer: BC Managed Care – PPO | Admitting: Internal Medicine

## 2020-12-30 VITALS — BP 105/71 | HR 79 | Temp 97.8°F | Resp 13 | Ht 68.0 in | Wt 274.0 lb

## 2020-12-30 DIAGNOSIS — Z1211 Encounter for screening for malignant neoplasm of colon: Secondary | ICD-10-CM | POA: Diagnosis not present

## 2020-12-30 MED ORDER — SODIUM CHLORIDE 0.9 % IV SOLN
500.0000 mL | Freq: Once | INTRAVENOUS | Status: AC
Start: 2020-12-30 — End: ?

## 2020-12-30 MED ORDER — SODIUM CHLORIDE 0.9 % IV SOLN: 500.0000 mL | Freq: Once | INTRAVENOUS | Status: DC

## 2020-12-30 NOTE — Progress Notes (Signed)
Medical history reviewed with no changes since PV. VS assessed by C.W 

## 2020-12-30 NOTE — Progress Notes (Signed)
Pt Drowsy. VSS. To PACU, report to RN. No anesthetic complications noted.  

## 2020-12-30 NOTE — Op Note (Signed)
[facility] Patient Name: Adam Adkins Procedure Date: 12/30/2020 10:18 AM MRN: [id number] Endoscopist: Beverley Fiedler , MD Age: 50 Referring MD:  Date of Birth: May 28, 1971 Gender: Male Account #: [id number] Procedure:                Colonoscopy Indications:              Screening for colorectal malignant neoplasm, This                            is the patient's first colonoscopy Medicines:                Monitored Anesthesia Care Procedure:                Pre-Anesthesia Assessment:                           - Prior to the procedure, a History and Physical                            was performed, and patient medications and                            allergies were reviewed. The patient's tolerance of                            previous anesthesia was also reviewed. The risks                            and benefits of the procedure and the sedation                            options and risks were discussed with the patient.                            All questions were answered, and informed consent                            was obtained. Prior Anticoagulants: The patient has                            taken no previous anticoagulant or antiplatelet                            agents. ASA Grade Assessment: II - A patient with                            mild systemic disease. After reviewing the risks                            and benefits, the patient was deemed in                            satisfactory condition to undergo the procedure.  After obtaining informed consent, the colonoscope                            was passed under direct vision. Throughout the                            procedure, the patient's blood pressure, pulse, and                            oxygen saturations were monitored continuously. The                            Olympus CF-HQ190L ([id number]) Colonoscope was                            introduced through the anus and  advanced to the                            cecum, identified by its appearance. The                            colonoscopy was performed without difficulty. The                            patient tolerated the procedure well. The quality                            of the bowel preparation was good. The ileocecal                            valve, appendiceal orifice, and rectum were                            photographed. Scope In: 10:34:41 AM Scope Out: 10:51:21 AM Scope Withdrawal Time: 0 hours 8 minutes 44 seconds  Total Procedure Duration: 0 hours 16 minutes 40 seconds  Findings:                 The digital rectal exam was normal.                           Multiple small and large-mouthed diverticula were                            found in the sigmoid colon, descending colon,                            transverse colon and ascending colon.                           Internal hemorrhoids were found during                            retroflexion. The hemorrhoids were small.  The exam was otherwise without abnormality. Complications:            No immediate complications. Estimated Blood Loss:     Estimated blood loss: none. Impression:               - Diverticulosis in the sigmoid colon, in the                            descending colon, in the transverse colon and in                            the ascending colon.                           - Internal hemorrhoids.                           - The examination was otherwise normal. No polyps                            seen.                           - No specimens collected. Recommendation:           - Patient has a contact number available for                            emergencies. The signs and symptoms of potential                            delayed complications were discussed with the                            patient. Return to normal activities tomorrow.                            Written discharge  instructions were provided to the                            patient.                           - Resume previous diet.                           - Continue present medications.                           - Repeat colonoscopy in 10 years for screening                            purposes. Beverley Fiedler, MD 12/30/2020 10:54:43 AM This report has been signed electronically.

## 2020-12-30 NOTE — Patient Instructions (Signed)
Handouts given:  Diverticulosis, Hemorrhoids Resume previous diet Continue current medications Repeat in 10 years!!!  YOU HAD AN ENDOSCOPIC PROCEDURE TODAY AT THE [facility]:   Refer to the procedure report that was given to you for any specific questions about what was found during the examination.  If the procedure report does not answer your questions, please call your gastroenterologist to clarify.  If you requested that your care partner not be given the details of your procedure findings, then the procedure report has been included in a sealed envelope for you to review at your convenience later.  YOU SHOULD EXPECT: Some feelings of bloating in the abdomen. Passage of more gas than usual.  Walking can help get rid of the air that was put into your GI tract during the procedure and reduce the bloating. If you had a lower endoscopy (such as a colonoscopy or flexible sigmoidoscopy) you may notice spotting of blood in your stool or on the toilet paper. If you underwent a bowel prep for your procedure, you may not have a normal bowel movement for a few days.  Please Note:  You might notice some irritation and congestion in your nose or some drainage.  This is from the oxygen used during your procedure.  There is no need for concern and it should clear up in a day or so.  SYMPTOMS TO REPORT IMMEDIATELY:   Following lower endoscopy (colonoscopy or flexible sigmoidoscopy):  Excessive amounts of blood in the stool  Significant tenderness or worsening of abdominal pains  Swelling of the abdomen that is new, acute  Fever of 100F or higher  For urgent or emergent issues, a gastroenterologist can be reached at any hour by calling [phone]. Do not use MyChart messaging for urgent concerns.   DIET:  We do recommend a small meal at first, but then you may proceed to your regular diet.  Drink plenty of fluids but you should avoid alcoholic beverages for 24 hours.  ACTIVITY:  You  should plan to take it easy for the rest of today and you should NOT DRIVE or use heavy machinery until tomorrow (because of the sedation medicines used during the test).    FOLLOW UP: Our staff will call the number listed on your records 48-72 hours following your procedure to check on you and address any questions or concerns that you may have regarding the information given to you following your procedure. If we do not reach you, we will leave a message.  We will attempt to reach you two times.  During this call, we will ask if you have developed any symptoms of COVID 19. If you develop any symptoms (ie: fever, flu-like symptoms, shortness of breath, cough etc.) before then, please call [phone].  If you test positive for Covid 19 in the 2 weeks post procedure, please call and report this information to [place].    If any biopsies were taken you will be contacted by phone or by letter within the next 1-3 weeks.  Please call us at [phone] if you have not heard about the biopsies in 3 weeks.   SIGNATURES/CONFIDENTIALITY: You and/or your care partner have signed paperwork which will be entered into your electronic medical record.  These signatures attest to the fact that that the information above on your After Visit Summary has been reviewed and is understood.  Full responsibility of the confidentiality of this discharge information lies with you and/or your care-partner.

## 2020-12-31 ENCOUNTER — Other Ambulatory Visit: Payer: Self-pay | Admitting: Family Medicine

## 2021-01-03 ENCOUNTER — Telehealth: Payer: Self-pay

## 2021-01-03 NOTE — Telephone Encounter (Signed)
  Follow up Call-  Call back number 12/30/2020  Post procedure Call Back phone  # [phone]  Permission to leave phone message Yes  Some recent data might be hidden     Patient questions:  Do you have a fever, pain , or abdominal swelling? No. Pain Score  0 *  Have you tolerated food without any problems? Yes  Have you been able to return to your normal activities? Yes.    Do you have any questions about your discharge instructions: Diet   No. Medications  No. Follow up visit  No.  Do you have questions or concerns about your Care? No.  Actions: * If pain score is 4 or above: No action needed, pain <4.  1. Have you developed a fever since your procedure? No  2.   Have you had an respiratory symptoms (SOB or cough) since your procedure? No  3.   Have you tested positive for COVID 19 since your procedure No  4.   Have you had any family members/close contacts diagnosed with the COVID 19 since your procedure?  No   If yes to any of these questions please route to Laverna Peace, RN and Karlton Lemon, RN

## 2021-02-16 ENCOUNTER — Telehealth (INDEPENDENT_AMBULATORY_CARE_PROVIDER_SITE_OTHER): Payer: BC Managed Care – PPO | Admitting: Family Medicine

## 2021-02-16 ENCOUNTER — Encounter: Payer: Self-pay | Admitting: Family Medicine

## 2021-02-16 DIAGNOSIS — M5430 Sciatica, unspecified side: Secondary | ICD-10-CM | POA: Diagnosis not present

## 2021-02-16 DIAGNOSIS — U071 COVID-19: Secondary | ICD-10-CM | POA: Diagnosis not present

## 2021-02-16 MED ORDER — MOLNUPIRAVIR EUA 200MG CAPSULE: 4.0000 | ORAL_CAPSULE | Freq: Two times a day (BID) | ORAL | 0 refills | Status: AC

## 2021-02-16 NOTE — Progress Notes (Signed)
Virtual Visit via Video Note  I connected with Adam Adkins on 02/16/21 at  1:00 PM EDT by a video enabled telemedicine application 2/2 COVID-19 pandemic and verified that I am speaking with the correct person using two identifiers.  Location patient: home Location provider:work or home office Persons participating in the virtual visit: patient, provider  I discussed the limitations of evaluation and management by telemedicine and the availability of in person appointments. The patient expressed understanding and agreed to proceed.   HPI: Pt travelled to [place] over the wknd.  He then developed HA, chills, stuffy, sore throat, productive cough, rhinorrhea No longer having fever.  Tested positive at [facility] on Mon 6/13.  Pt had a negative test on Sunday at home. Denies ear pain/pressure, n/v, diarrherea, loss of taste or smell.   Taking Mucinex.    States his grandson was sick recently with a cough.    Patient mentions improvement in sciatica symptoms.  Stretching twice a day.  Notices symptoms returned with increased activity.  Patient inquires about what else can be done.  ROS: See pertinent positives and negatives per HPI.  Past Medical History:  Diagnosis Date   Allergy    seasonal   ERECTILE DYSFUNCTION 08/21/2008   HYPERTENSION 05/10/2007   Morbid obesity (HCC)    Pure hypercholesterolemia 04/24/2008    Past Surgical History:  Procedure Laterality Date   HIATAL HERNIA REPAIR  07/22/2012   Procedure: HERNIA REPAIR HIATAL;  Surgeon: Atilano Ina, MD,FACS;  Location: [facility];  Service: General;  Laterality: N/A;   LAPAROSCOPIC GASTRIC BANDING  07/22/2012   Procedure: LAPAROSCOPIC GASTRIC BANDING;  Surgeon: Atilano Ina, MD,FACS;  Location: [facility];  Service: General;  Laterality: N/A;   WISDOM TOOTH EXTRACTION      Family History  Problem Relation Age of Onset   Breast cancer Maternal Grandmother    Prostate cancer Paternal Grandfather    Colon  cancer Neg Hx    Colon polyps Neg Hx    Esophageal cancer Neg Hx    Rectal cancer Neg Hx    Stomach cancer Neg Hx      Current Outpatient Medications:    atorvastatin (LIPITOR) 80 MG tablet, Take 1 tablet by mouth once daily, Disp: 90 tablet, Rfl: 0   lisinopril-hydrochlorothiazide (ZESTORETIC) 20-25 MG tablet, Take 1 tablet by mouth once daily, Disp: 90 tablet, Rfl: 0   loratadine (CLARITIN) 10 MG tablet, Take 10 mg by mouth daily as needed., Disp: , Rfl:    triamcinolone (NASACORT) 55 MCG/ACT AERO nasal inhaler, Place 2 sprays into the nose daily., Disp: , Rfl:    triamcinolone ointment (KENALOG) 0.1 %, Apply 1 application topically 2 (two) times daily., Disp: 453.6 g, Rfl: 1   fluticasone (FLONASE) 50 MCG/ACT nasal spray, USE ONE SPRAY IN EACH NOSTRIL EVERY DAY (Patient not taking: No sig reported), Disp: 16 g, Rfl: 5   hydrOXYzine (ATARAX/VISTARIL) 25 MG tablet, Take 1 tablet (25 mg total) by mouth 3 (three) times daily as needed for itching. (Patient not taking: No sig reported), Disp: 60 tablet, Rfl: 1   sildenafil (REVATIO) 20 MG tablet, 2-5 tablets  Daily as needed.  For ED (Patient not taking: No sig reported), Disp: 90 tablet, Rfl: 4  Current Facility-Administered Medications:    0.9 %  sodium chloride infusion, 500 mL, Intravenous, Once, Pyrtle, Carie Caddy, MD  EXAMTheodoro Kos per patient if applicable: RR between 12-20 bpm  GENERAL: alert, oriented, appears well and in no  acute distress  HEENT: atraumatic, conjunctiva clear, no obvious abnormalities on inspection of external nose and ears  NECK: normal movements of the head and neck  LUNGS: on inspection no signs of respiratory distress, breathing rate appears normal, no obvious gross SOB, gasping or wheezing  CV: no obvious cyanosis  MS: moves all visible extremities without noticeable abnormality  PSYCH/NEURO: pleasant and cooperative, no obvious depression or anxiety, speech and thought processing grossly  intact  ASSESSMENT AND PLAN:  Discussed the following assessment and plan:  COVID-19 virus infection  -Positive test on 02/14/2021 -Continue supportive care including rest, OTC medications -Given strict precautions - Plan: molnupiravir EUA 200 mg CAPS  Sciatica, unspecified laterality -Discussed continue stretching and OTC topical analgesics such as Aspercreme -We will reevaluate at next OFV -For continued or worsening symptoms consider PT  Follow-up as needed  I discussed the assessment and treatment plan with the patient. The patient was provided an opportunity to ask questions and all were answered. The patient agreed with the plan and demonstrated an understanding of the instructions.   The patient was advised to call back or seek an in-person evaluation if the symptoms worsen or if the condition fails to improve as anticipated.   Adam Saint, MD

## 2021-03-31 ENCOUNTER — Other Ambulatory Visit: Payer: Self-pay | Admitting: Family Medicine

## 2021-06-06 ENCOUNTER — Ambulatory Visit: Payer: BC Managed Care – PPO | Attending: Family

## 2021-06-06 DIAGNOSIS — Z23 Encounter for immunization: Secondary | ICD-10-CM

## 2021-06-06 NOTE — Progress Notes (Signed)
   Covid-19 Vaccination Clinic  Name:  TANIELA FELTUS    MRN: [id number] DOB: [birth year]  06/06/2021  Mr. Barbar was observed post Covid-19 immunization for 15 minutes without incident. He was provided with Vaccine Information Sheet and instruction to access the V-Safe system.   Mr. Carsten was instructed to call 911 with any severe reactions post vaccine: Difficulty breathing  Swelling of face and throat  A fast heartbeat  A bad rash all over body  Dizziness and weakness

## 2021-07-02 ENCOUNTER — Other Ambulatory Visit: Payer: Self-pay | Admitting: Family Medicine

## 2021-07-11 ENCOUNTER — Other Ambulatory Visit: Payer: Self-pay

## 2021-07-11 ENCOUNTER — Encounter: Payer: Self-pay | Admitting: Family Medicine

## 2021-07-11 ENCOUNTER — Ambulatory Visit (INDEPENDENT_AMBULATORY_CARE_PROVIDER_SITE_OTHER): Payer: BC Managed Care – PPO | Admitting: Family Medicine

## 2021-07-11 VITALS — BP 122/76 | HR 104 | Temp 98.5°F | Ht 66.5 in | Wt 278.8 lb

## 2021-07-11 DIAGNOSIS — E782 Mixed hyperlipidemia: Secondary | ICD-10-CM | POA: Diagnosis not present

## 2021-07-11 DIAGNOSIS — I10 Essential (primary) hypertension: Secondary | ICD-10-CM

## 2021-07-11 DIAGNOSIS — F439 Reaction to severe stress, unspecified: Secondary | ICD-10-CM

## 2021-07-11 DIAGNOSIS — Z1159 Encounter for screening for other viral diseases: Secondary | ICD-10-CM

## 2021-07-11 DIAGNOSIS — Z0000 Encounter for general adult medical examination without abnormal findings: Secondary | ICD-10-CM | POA: Diagnosis not present

## 2021-07-11 DIAGNOSIS — Z125 Encounter for screening for malignant neoplasm of prostate: Secondary | ICD-10-CM | POA: Diagnosis not present

## 2021-07-11 LAB — CBC WITH DIFFERENTIAL/PLATELET
Basophils Absolute: 0.1 10*3/uL (ref 0.0–0.1)
Basophils Relative: 1.2 % (ref 0.0–3.0)
Eosinophils Absolute: 0.1 10*3/uL (ref 0.0–0.7)
Eosinophils Relative: 1.6 % (ref 0.0–5.0)
HCT: 43.4 % (ref 39.0–52.0)
Hemoglobin: 14.8 g/dL (ref 13.0–17.0)
Lymphocytes Relative: 20 % (ref 12.0–46.0)
Lymphs Abs: 1.3 10*3/uL (ref 0.7–4.0)
MCHC: 34 g/dL (ref 30.0–36.0)
MCV: 91.1 fl (ref 78.0–100.0)
Monocytes Absolute: 0.7 10*3/uL (ref 0.1–1.0)
Monocytes Relative: 9.9 % (ref 3.0–12.0)
Neutro Abs: 4.5 10*3/uL (ref 1.4–7.7)
Neutrophils Relative %: 67.3 % (ref 43.0–77.0)
Platelets: 219 10*3/uL (ref 150.0–400.0)
RBC: 4.77 Mil/uL (ref 4.22–5.81)
RDW: 13.5 % (ref 11.5–15.5)
WBC: 6.7 10*3/uL (ref 4.0–10.5)

## 2021-07-11 LAB — LIPID PANEL
Cholesterol: 195 mg/dL (ref 0–200)
HDL: 36.5 mg/dL — ABNORMAL LOW (ref >39.00)
LDL Cholesterol: 138 mg/dL — ABNORMAL HIGH (ref 0–99)
NonHDL: 158.63
Total CHOL/HDL Ratio: 5
Triglycerides: 104 mg/dL (ref 0.0–149.0)
VLDL: 20.8 mg/dL (ref 0.0–40.0)

## 2021-07-11 LAB — TSH: TSH: 0.7 u[IU]/mL (ref 0.35–5.50)

## 2021-07-11 LAB — COMPREHENSIVE METABOLIC PANEL
ALT: 21 U/L (ref 0–53)
AST: 21 U/L (ref 0–37)
Albumin: 4.3 g/dL (ref 3.5–5.2)
Alkaline Phosphatase: 60 U/L (ref 39–117)
BUN: 18 mg/dL (ref 6–23)
CO2: 32 mEq/L (ref 19–32)
Calcium: 9.8 mg/dL (ref 8.4–10.5)
Chloride: 99 mEq/L (ref 96–112)
Creatinine, Ser: 1.22 mg/dL (ref 0.40–1.50)
GFR: 68.08 mL/min (ref >60.00)
Glucose, Bld: 99 mg/dL (ref 70–99)
Potassium: 4.3 mEq/L (ref 3.5–5.1)
Sodium: 137 mEq/L (ref 135–145)
Total Bilirubin: 0.9 mg/dL (ref 0.2–1.2)
Total Protein: 7.2 g/dL (ref 6.0–8.3)

## 2021-07-11 LAB — VITAMIN D 25 HYDROXY (VIT D DEFICIENCY, FRACTURES): VITD: 20.86 ng/mL — ABNORMAL LOW (ref 30.00–100.00)

## 2021-07-11 LAB — PSA: PSA: 0.4 ng/mL (ref 0.10–4.00)

## 2021-07-11 LAB — T4, FREE: Free T4: 0.93 ng/dL (ref 0.60–1.60)

## 2021-07-11 LAB — HEMOGLOBIN A1C: Hgb A1c MFr Bld: 5.9 % (ref 4.6–6.5)

## 2021-07-11 MED ORDER — ATORVASTATIN CALCIUM 80 MG PO TABS: ORAL_TABLET | ORAL | 3 refills | Status: DC

## 2021-07-11 NOTE — Progress Notes (Signed)
Subjective:     Adam Adkins is a 50 y.o. male and is here for a comprehensive physical exam. The patient reports increased stress at work after new promotion.  Patient notes feeling overwhelmed/having panic attack.  Tries deep breathing and mindfulness but notes panic attacks can occur out of nowhere.  Otherwise patient doing well.  Stable on current medications.  Colonoscopy completed 12/30/2020.  Social History   Socioeconomic History   Marital status: Married    Spouse name: Not on file   Number of children: Not on file   Years of education: Not on file   Highest education level: Not on file  Occupational History   Not on file  Tobacco Use   Smoking status: Former    Packs/day: 0.30    Years: 10.00    Pack years: 3.00    Types: Cigarettes    Quit date: 09/04/1998    Years since quitting: 22.8   Smokeless tobacco: Never  Vaping Use   Vaping Use: Never used  Substance and Sexual Activity   Alcohol use: No   Drug use: No   Sexual activity: Yes  Other Topics Concern   Not on file  Social History Narrative   Not on file   Social Determinants of Health   Financial Resource Strain: Not on file  Food Insecurity: Not on file  Transportation Needs: Not on file  Physical Activity: Not on file  Stress: Not on file  Social Connections: Not on file  Intimate Partner Violence: Not on file   Health Maintenance  Topic Date Due   Hepatitis C Screening  Never done   Zoster Vaccines- Shingrix (1 of 2) Never done   TETANUS/TDAP  05/17/2030   COLONOSCOPY (Pts 45-31yrs Insurance coverage will need to be confirmed)  12/31/2030   INFLUENZA VACCINE  Completed   COVID-19 Vaccine  Completed   HIV Screening  Completed   Pneumococcal Vaccine 55-20 Years old  Aged Out   HPV VACCINES  Aged Out    The following portions of the patient's history were reviewed and updated as appropriate: allergies, current medications, past family history, past medical history, past social history, past  surgical history, and problem list.  Review of Systems Pertinent items noted in HPI and remainder of comprehensive ROS otherwise negative.   Objective:    BP 122/76 (BP Location: Right Arm, Patient Position: Sitting, Cuff Size: Large)   Pulse (!) 104   Temp 98.5 F (36.9 C) (Oral)   Ht 5' 6.5" (1.689 m)   Wt 278 lb 12.8 oz (126.5 kg)   SpO2 98%   BMI 44.33 kg/m  General appearance: alert, cooperative, and no distress Head: Normocephalic, without obvious abnormality, atraumatic Eyes: conjunctivae/corneas clear. PERRL, EOM's intact. Fundi benign. Ears: normal TM's and external ear canals both ears Nose: Nares normal. Septum midline. Mucosa normal. No drainage or sinus tenderness. Throat: lips, mucosa, and tongue normal; teeth and gums normal Neck: no adenopathy, no carotid bruit, no JVD, supple, symmetrical, trachea midline, and thyroid not enlarged, symmetric, no tenderness/mass/nodules Lungs: clear to auscultation bilaterally Heart: regular rate and rhythm, S1, S2 normal, no murmur, click, rub or gallop Abdomen: soft, non-tender; bowel sounds normal; no masses,  no organomegaly Extremities: extremities normal, atraumatic, no cyanosis or edema Pulses: 2+ and symmetric Skin: Skin color, texture, turgor normal. No rashes or lesions Lymph nodes: Cervical, supraclavicular, and axillary nodes normal. Neurologic: Alert and oriented X 3, normal strength and tone. Normal symmetric reflexes. Normal coordination and gait  Assessment:    Healthy male exam.      Plan:    Anticipatory guidance given including wearing seatbelts, smoke detectors in the home, increasing physical activity, increasing p.o. intake of water and vegetables. -labs -Reviewed immunizations.  Patient had COVID booster and influenza vaccine a few weeks ago. -Colonoscopy done 12/30/2020. -Given handout -Next CPE in 1 year See After Visit Summary for Counseling Recommendations   Stress -PHQ-9 score 0.  GAD-7 score  4 -Continue deep breathing, mindfulness.  Also discussed exercise and other ways to reduce stress level -continue to monitor -Consider counseling  - Plan: Vitamin D, 25-hydroxy, Hemoglobin A1c, TSH, T4, Free, CBC with Differential/Platelet  Need for hepatitis C screening test - Plan: Hep C Antibody  Screening for prostate cancer - Plan: PSA  HLD, mixed -Continue Lipitor 80 mg daily - Plan: Lipid panel, lipitor 80 mg  Essential hypertension -Controlled -Continue current medications including lisinopril-hydrochlorothiazide 20-25 mg daily -Continue lifestyle modifications -Continue checking BP at home  Follow-up in the next 3 months, sooner if needed for stress.  Abbe Amsterdam, MD

## 2021-07-12 LAB — HEPATITIS C ANTIBODY
Hepatitis C Ab: NONREACTIVE
SIGNAL TO CUT-OFF: 0.14 (ref <1.00)

## 2021-07-15 ENCOUNTER — Other Ambulatory Visit: Payer: Self-pay | Admitting: Family Medicine

## 2021-07-15 DIAGNOSIS — E559 Vitamin D deficiency, unspecified: Secondary | ICD-10-CM

## 2021-07-15 MED ORDER — VITAMIN D (ERGOCALCIFEROL) 1.25 MG (50000 UNIT) PO CAPS: 50000.0000 [IU] | ORAL_CAPSULE | ORAL | 0 refills | Status: DC

## 2021-08-16 ENCOUNTER — Other Ambulatory Visit: Payer: Self-pay | Admitting: Family Medicine

## 2021-12-14 ENCOUNTER — Ambulatory Visit: Payer: BC Managed Care – PPO | Admitting: Family Medicine

## 2021-12-14 VITALS — BP 121/85 | HR 109 | Temp 98.5°F | Wt 282.2 lb

## 2021-12-14 DIAGNOSIS — I10 Essential (primary) hypertension: Secondary | ICD-10-CM

## 2021-12-14 DIAGNOSIS — M62838 Other muscle spasm: Secondary | ICD-10-CM

## 2021-12-14 DIAGNOSIS — F419 Anxiety disorder, unspecified: Secondary | ICD-10-CM

## 2021-12-14 DIAGNOSIS — T464X5A Adverse effect of angiotensin-converting-enzyme inhibitors, initial encounter: Secondary | ICD-10-CM

## 2021-12-14 DIAGNOSIS — F439 Reaction to severe stress, unspecified: Secondary | ICD-10-CM | POA: Diagnosis not present

## 2021-12-14 DIAGNOSIS — M542 Cervicalgia: Secondary | ICD-10-CM | POA: Diagnosis not present

## 2021-12-14 DIAGNOSIS — R058 Other specified cough: Secondary | ICD-10-CM

## 2021-12-14 MED ORDER — OLMESARTAN MEDOXOMIL-HCTZ 20-12.5 MG PO TABS: 1.0000 | ORAL_TABLET | Freq: Every day | ORAL | 1 refills | Status: DC

## 2021-12-14 MED ORDER — CYCLOBENZAPRINE HCL 5 MG PO TABS: 5.0000 mg | ORAL_TABLET | Freq: Every evening | ORAL | 0 refills | Status: DC | PRN

## 2021-12-14 NOTE — Progress Notes (Signed)
Subjective:  ? ? Patient ID: Adam Adkins, male    DOB: Nov 19, 1970, 51 y.o.   MRN: [id number] ? ?Chief Complaint  ?Patient presents with  ? Neck Pain  ?  Started about 2 wks ago. On back side of left side. Taken advil  ? ? ?HPI ?Patient was seen today for ongoing concerns.  Patient endorses posterior/left lateral neck pain x2 weeks.  Tried Advil which helps.  Also tried wife's Robaxin which helped but later developed a headache.  Does not recall injury.  Causing difficulty sleeping.  Patient notes increased stress at work and related to a family friend's health.  Patient states stress is starting to cause anxiety.  Patient endorses increased/racing thoughts. In the past went to counseling. ? ?Pt with intermittent throat clearing/cough.  On lisionopril-HCTZ 20-25 mg daily.  BP controlled. ? ?Past Medical History:  ?Diagnosis Date  ? Allergy   ? seasonal  ? ERECTILE DYSFUNCTION 08/21/2008  ? HYPERTENSION 05/10/2007  ? Morbid obesity (HCC)   ? Pure hypercholesterolemia 04/24/2008  ? ? ?No Known Allergies ? ?ROS ?General: Denies fever, chills, night sweats, changes in weight, changes in appetite ?HEENT: Denies headaches, ear pain, changes in vision, rhinorrhea, sore throat ?CV: Denies CP, palpitations, SOB, orthopnea ?Pulm: Denies SOB, cough, wheezing ?GI: Denies abdominal pain, nausea, vomiting, diarrhea, constipation ?GU: Denies dysuria, hematuria, frequency ?Msk: Denies muscle cramps, joint pains +neck pain ?Neuro: Denies weakness, numbness, tingling ?Skin: Denies rashes, bruising ?Psych: Denies depression, hallucinations  +anxiety, increased stress ? ?   ?Objective:  ?  ?Blood pressure 121/85, pulse (!) 109, temperature 98.5 ?F (36.9 ?C), temperature source Oral, weight 282 lb 3.2 oz (128 kg), SpO2 100 %. ? ?Gen. Pleasant, well-nourished, in no distress, normal affect   ?HEENT: [place]/AT, face symmetric, conjunctiva clear, no scleral icterus, PERRLA, EOMI, nares patent without drainage, ?Neck: No JVD, no thyromegaly, no  carotid bruits ?Lungs: no accessory muscle use, CTAB, no wheezes or rales ?Cardiovascular: RRR, no m/r/g, no peripheral edema ?Abdomen: BS present, soft, NT/ND. ?Musculoskeletal: TTP of upper lateral and L cervical trapezius muscle.  FROM LUE.  no deformities, no cyanosis or clubbing, normal tone ?Neuro:  A&Ox3, CN II-XII intact, normal gait ?Skin:  Warm, no lesions/ rash ? ? ?Wt Readings from Last 3 Encounters:  ?12/14/21 282 lb 3.2 oz (128 kg)  ?07/11/21 278 lb 12.8 oz (126.5 kg)  ?12/30/20 274 lb (124.3 kg)  ? ? ?Lab Results  ?Component Value Date  ? WBC 6.7 07/11/2021  ? HGB 14.8 07/11/2021  ? HCT 43.4 07/11/2021  ? PLT 219.0 07/11/2021  ? GLUCOSE 99 07/11/2021  ? CHOL 195 07/11/2021  ? TRIG 104.0 07/11/2021  ? HDL 36.50 (L) 07/11/2021  ? LDLDIRECT 170.0 09/13/2009  ? LDLCALC 138 (H) 07/11/2021  ? ALT 21 07/11/2021  ? AST 21 07/11/2021  ? NA 137 07/11/2021  ? K 4.3 07/11/2021  ? CL 99 07/11/2021  ? CREATININE 1.22 07/11/2021  ? BUN 18 07/11/2021  ? CO2 32 07/11/2021  ? TSH 0.70 07/11/2021  ? PSA 0.40 07/11/2021  ? HGBA1C 5.9 07/11/2021  ? ? ?  12/14/2021  ?  1:20 PM 07/11/2021  ?  9:05 AM  ?Depression screen PHQ 2/9  ?Decreased Interest 2 0  ?Down, Depressed, Hopeless 2 0  ?PHQ - 2 Score 4 0  ?Altered sleeping 1 0  ?Tired, decreased energy 1 0  ?Change in appetite 1 0  ?Feeling bad or failure about yourself  1 0  ?Trouble concentrating 0 0  ?  Moving slowly or fidgety/restless 0 0  ?Suicidal thoughts 0 0  ?PHQ-9 Score 8 0  ?Difficult doing work/chores Somewhat difficult   ? ? ?  12/14/2021  ?  2:13 PM 07/11/2021  ?  9:06 AM  ?GAD 7 : Generalized Anxiety Score  ?Nervous, Anxious, on Edge 2 1  ?Control/stop worrying 2 1  ?Worry too much - different things 2 1  ?Trouble relaxing 2 0  ?Restless 3 0  ?Easily annoyed or irritable 0 0  ?Afraid - awful might happen 3 1  ?Total GAD 7 Score 14 4  ?Anxiety Difficulty Somewhat difficult Not difficult at all  ? ? ? ? ?Assessment/Plan: ? ?Trapezius muscle spasm  ?-new  problem ?-Discussed likely 2/2 increased stress/anxiety ?-Discussed relaxation techniques ?-Continue supportive care including heat, massage, rest, stretching, topical analgesics, Tylenol or NSAIDs as needed ?-start flexeril qhs prn ?- Plan: cyclobenzaprine (FLEXERIL) 5 MG tablet ? ?Neck pain ?-new problem ?-Likely 2/2 trapezius muscle spasm from increased stress ?-Supportive care as above ? ?Stress ?-Likely contributing to muscle spasm/strain leading to headache ?-Discussed the importance of decreasing stress ?-Given handout ? ?Anxiety ?-new problem ?-GAD-7 score 14 this visit ?-PHQ-9 score 8 ?-Discussed various options including restarting counseling and medications. ?-Also discussed the importance of self-care, deep breathing exercise, mindfulness ?-Given handout ?-We will have patient follow-up in 1 month ? ?Cough due to ACE inhibitor ?-new problem ?- Plan: olmesartan-hydrochlorothiazide (BENICAR HCT) 20-12.5 MG tablet ? ?Essential hypertension ?-Well-controlled however cough noted during exam ?-Will switch lisinopril-hydrochlorothiazide 20-25 mg to olmesartan-hydrochlorothiazide 20-12.5. ?-Patient advised may need to adjust dose of BP meds given decreased amount of hydrochlorothiazide and this new combination pill. ?-Continue to monitor BP ? - Plan: olmesartan-hydrochlorothiazide (BENICAR HCT) 20-12.5 MG tablet ? ?F/u in 1 month ? ?Abbe Amsterdam, MD ?

## 2021-12-14 NOTE — Patient Instructions (Addendum)
The lisinopril one of the components in your blood pressure medicine may be contributing to your cough/throat clearing.  I sent in a new prescription for a different blood pressure medication olmesartan-hydrochlorothiazide 20-12.5 mg to see if you notice improvement  ?

## 2021-12-23 ENCOUNTER — Encounter: Payer: Self-pay | Admitting: Family Medicine

## 2022-01-13 ENCOUNTER — Other Ambulatory Visit: Payer: Self-pay | Admitting: Family Medicine

## 2022-01-13 DIAGNOSIS — M62838 Other muscle spasm: Secondary | ICD-10-CM

## 2022-06-06 ENCOUNTER — Other Ambulatory Visit: Payer: Self-pay | Admitting: Family Medicine

## 2022-06-06 DIAGNOSIS — R058 Other specified cough: Secondary | ICD-10-CM

## 2022-06-06 DIAGNOSIS — I10 Essential (primary) hypertension: Secondary | ICD-10-CM

## 2022-06-15 ENCOUNTER — Encounter: Payer: Self-pay | Admitting: Family Medicine

## 2022-06-15 ENCOUNTER — Ambulatory Visit: Payer: BC Managed Care – PPO | Admitting: Family Medicine

## 2022-06-15 VITALS — BP 124/68 | HR 85 | Temp 99.4°F | Wt 285.8 lb

## 2022-06-15 DIAGNOSIS — R058 Other specified cough: Secondary | ICD-10-CM | POA: Diagnosis not present

## 2022-06-15 DIAGNOSIS — F411 Generalized anxiety disorder: Secondary | ICD-10-CM

## 2022-06-15 DIAGNOSIS — I10 Essential (primary) hypertension: Secondary | ICD-10-CM

## 2022-06-15 DIAGNOSIS — T464X5A Adverse effect of angiotensin-converting-enzyme inhibitors, initial encounter: Secondary | ICD-10-CM

## 2022-06-15 DIAGNOSIS — F320 Major depressive disorder, single episode, mild: Secondary | ICD-10-CM | POA: Diagnosis not present

## 2022-06-15 DIAGNOSIS — S70361A Insect bite (nonvenomous), right thigh, initial encounter: Secondary | ICD-10-CM

## 2022-06-15 MED ORDER — SERTRALINE HCL 25 MG PO TABS: 25.0000 mg | ORAL_TABLET | Freq: Every day | ORAL | 3 refills | Status: DC

## 2022-06-15 MED ORDER — OLMESARTAN MEDOXOMIL-HCTZ 20-12.5 MG PO TABS: 1.0000 | ORAL_TABLET | Freq: Every day | ORAL | 3 refills | Status: DC

## 2022-06-15 NOTE — Progress Notes (Signed)
Subjective:    Patient ID: Adam Adkins, male    DOB: 22-Feb-1971, 51 y.o.   MRN: [id number]  Chief Complaint  Patient presents with   Anxiety    Comes and goes, some good days, other days are a struggle.     HPI Patient was seen today for f/u.  Pt endorses continued anxiety.  States symptoms come and go, but have become increasingly difficult.  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  Has decreased energy, changes in sleep and appetite.  Pt interested in medication options.  Patient speaking counselor/friend.  Pt with noticeable cough/clearing throat.  States he was given a refill on lisinopril.  Med was previously d/c'd due to causing a dry cough.  Past Medical History:  Diagnosis Date   Allergy    seasonal   ERECTILE DYSFUNCTION 08/21/2008   HYPERTENSION 05/10/2007   Morbid obesity (HCC)    Pure hypercholesterolemia 04/24/2008    No Known Allergies  ROS General: Denies fever, chills, night sweats, changes in weight  +decreased energy, change in appetite. HEENT: Denies headaches, ear pain, changes in vision, rhinorrhea, sore throat   +throat clearing CV: Denies CP, palpitations, SOB, orthopnea Pulm: Denies SOB, wheezing  +dry cough GI: Denies abdominal pain, nausea, vomiting, diarrhea, constipation GU: Denies dysuria, hematuria, frequency Msk: Denies muscle cramps, joint pains Neuro: Denies weakness, numbness, tingling Skin: Denies rashes, bruising  Psych: Denies depression,  hallucinations  +anxiety    Objective:    Blood pressure 124/68, pulse 85, temperature 99.4 F (37.4 C), temperature source Oral, weight 285 lb 12.8 oz (129.6 kg), SpO2 96 %.  Gen. Pleasant, well-nourished, in no distress, normal affect   HEENT: [place]/AT, face symmetric, conjunctiva clear, no scleral icterus, PERRLA, EOMI, nares patent without drainage Lungs: no accessory muscle use, CTAB, no wheezes or rales Cardiovascular: RRR, no m/r/g, no peripheral  edema Musculoskeletal: No deformities, no cyanosis or clubbing, normal tone Neuro:  A&Ox3, CN II-XII intact, normal gait Skin:  Warm, no lesions/ rash  Wt Readings from Last 3 Encounters:  06/15/22 285 lb 12.8 oz (129.6 kg)  12/14/21 282 lb 3.2 oz (128 kg)  07/11/21 278 lb 12.8 oz (126.5 kg)    Lab Results  Component Value Date   WBC 6.7 07/11/2021   HGB 14.8 07/11/2021   HCT 43.4 07/11/2021   PLT 219.0 07/11/2021   GLUCOSE 99 07/11/2021   CHOL 195 07/11/2021   TRIG 104.0 07/11/2021   HDL 36.50 (L) 07/11/2021   LDLDIRECT 170.0 09/13/2009   LDLCALC 138 (H) 07/11/2021   ALT 21 07/11/2021   AST 21 07/11/2021   NA 137 07/11/2021   K 4.3 07/11/2021   CL 99 07/11/2021   CREATININE 1.22 07/11/2021   BUN 18 07/11/2021   CO2 32 07/11/2021   TSH 0.70 07/11/2021   PSA 0.40 07/11/2021   HGBA1C 5.9 07/11/2021      06/15/2022   10:40 AM 12/14/2021    2:13 PM 07/11/2021    9:06 AM  GAD 7 : Generalized Anxiety Score  Nervous, Anxious, on Edge 3 2 1   Control/stop worrying 3 2 1   Worry too much - different things 3 2 1   Trouble relaxing 2 2 0  Restless 2 3 0  Easily annoyed or irritable 0 0 0  Afraid - awful might happen 3 3 1   Total GAD 7 Score 16 14 4   Anxiety Difficulty Somewhat difficult Somewhat difficult Not difficult at all  06/15/2022   10:37 AM 12/14/2021    1:20 PM 07/11/2021    9:05 AM  Depression screen PHQ 2/9  Decreased Interest 1 2 0  Down, Depressed, Hopeless 1 2 0  PHQ - 2 Score 2 4 0  Altered sleeping 1 1 0  Tired, decreased energy 2 1 0  Change in appetite 2 1 0  Feeling bad or failure about yourself  2 1 0  Trouble concentrating 2 0 0  Moving slowly or fidgety/restless 1 0 0  Suicidal thoughts 0 0 0  PHQ-9 Score 12 8 0  Difficult doing work/chores Very difficult Somewhat difficult     Assessment/Plan:  GAD (generalized anxiety disorder) -GAD-7 score 16 this visit -Continue counseling -Discussed r/b/a a medication options -We will start  Zoloft 25 mg daily -Self-care encouraged -Given precautions -Given handout - Plan: sertraline (ZOLOFT) 25 MG tablet  Depression, major, single episode, mild (HCC)  -PHQ-9 score 12 this visit -Patient encouraged to continue counseling -Start Zoloft 25 mg daily -Precautions - Plan: sertraline (ZOLOFT) 25 MG tablet  Cough due to ACE inhibitor  -d/c lisinopril.  Previously stopped due to causing cough. -ensure pharmacy is aware. - Plan: olmesartan-hydrochlorothiazide (BENICAR HCT) 20-12.5 MG tablet  Essential hypertension -controlled -stop ACE-I as previously caused cough.  -restart olmesartan-hydrochlorothiazide 20-12.5 mg daily -Continue lifestyle modifications -Continue to monitor BP at home and keep a log to bring to clinic  - Plan: olmesartan-hydrochlorothiazide (BENICAR HCT) 20-12.5 MG tablet  F/u 4-6 weeks  Grier Mitts, MD

## 2022-06-15 NOTE — Patient Instructions (Signed)
A refill on your blood pressure medication olmesartan-hydrochlorothiazide was sent to your pharmacy just in case.  Stop taking the lisinopril as in the past it caused cough.  A prescription for Zoloft 25 mg was sent to your pharmacy.  You can take 1 tab daily to see if symptoms improve.  We will have you follow back up in the next 4-6 weeks, sooner if needed.

## 2022-07-03 ENCOUNTER — Other Ambulatory Visit: Payer: Self-pay | Admitting: Family Medicine

## 2022-07-03 DIAGNOSIS — M62838 Other muscle spasm: Secondary | ICD-10-CM

## 2022-08-06 ENCOUNTER — Other Ambulatory Visit: Payer: Self-pay | Admitting: Family Medicine

## 2022-08-06 DIAGNOSIS — E782 Mixed hyperlipidemia: Secondary | ICD-10-CM

## 2022-09-03 ENCOUNTER — Other Ambulatory Visit: Payer: Self-pay | Admitting: Family Medicine

## 2022-09-03 DIAGNOSIS — M62838 Other muscle spasm: Secondary | ICD-10-CM

## 2022-10-03 ENCOUNTER — Other Ambulatory Visit: Payer: Self-pay | Admitting: Family Medicine

## 2022-10-03 DIAGNOSIS — F320 Major depressive disorder, single episode, mild: Secondary | ICD-10-CM

## 2022-10-03 DIAGNOSIS — F411 Generalized anxiety disorder: Secondary | ICD-10-CM

## 2022-10-03 NOTE — Telephone Encounter (Signed)
Lov: 06/15/2022, instruction for pt to follow up for his Zoloft.   Contacted pt to schedule a follow up appt. Pt states he will call back after checking his schedule.

## 2022-11-03 ENCOUNTER — Other Ambulatory Visit: Payer: Self-pay | Admitting: Family Medicine

## 2022-11-03 DIAGNOSIS — E782 Mixed hyperlipidemia: Secondary | ICD-10-CM

## 2022-11-13 ENCOUNTER — Other Ambulatory Visit: Payer: Self-pay | Admitting: Family Medicine

## 2022-11-13 DIAGNOSIS — F411 Generalized anxiety disorder: Secondary | ICD-10-CM

## 2022-11-13 DIAGNOSIS — F320 Major depressive disorder, single episode, mild: Secondary | ICD-10-CM

## 2022-11-14 NOTE — Telephone Encounter (Signed)
Office visit 06/15/22 A prescription for Zoloft 25 mg was sent to your pharmacy. You can take 1 tab daily to see if symptoms improve. We will have you follow back up in the next 4-6 weeks, sooner if needed.  Okay to refill?

## 2022-11-15 ENCOUNTER — Telehealth (INDEPENDENT_AMBULATORY_CARE_PROVIDER_SITE_OTHER): Payer: BC Managed Care – PPO | Admitting: Family Medicine

## 2022-11-15 ENCOUNTER — Encounter: Payer: Self-pay | Admitting: Family Medicine

## 2022-11-15 VITALS — BP 124/85 | Wt 285.0 lb

## 2022-11-15 DIAGNOSIS — Z9884 Bariatric surgery status: Secondary | ICD-10-CM

## 2022-11-15 DIAGNOSIS — F411 Generalized anxiety disorder: Secondary | ICD-10-CM | POA: Diagnosis not present

## 2022-11-15 DIAGNOSIS — N529 Male erectile dysfunction, unspecified: Secondary | ICD-10-CM

## 2022-11-15 DIAGNOSIS — F320 Major depressive disorder, single episode, mild: Secondary | ICD-10-CM

## 2022-11-15 MED ORDER — SILDENAFIL CITRATE 20 MG PO TABS: ORAL_TABLET | ORAL | 4 refills | Status: DC

## 2022-11-15 MED ORDER — SERTRALINE HCL 25 MG PO TABS: 25.0000 mg | ORAL_TABLET | Freq: Every day | ORAL | 1 refills | Status: DC

## 2022-11-15 NOTE — Progress Notes (Signed)
Virtual Visit via Video Note  I connected with Adam Adkins on 11/15/22 at  4:30 PM EDT by a video enabled telemedicine application and verified that I am speaking with the correct person using two identifiers.  Location patient: home Location provider:work or home office Persons participating in the virtual visit: patient, provider  I discussed the limitations of evaluation and management by telemedicine and the availability of in person appointments. The patient expressed understanding and agreed to proceed.   HPI: Pt is a 52 yo male with pmh sig for HTN, HLD, h/o obesity s/p gastric lap band, h/o pulmonary nodules, ED, Sarcoidosis who was seen for f/u.  Pt just celebrated a birthday.  Taking sertraline 25 mg daily.  Needs a refill.  At first noticed wt gain on med, but getting back into routine of exercising.  Feels like in a better space overall for the last month.   Getting stressors under control.  Noticing less muscle tension when stress is decreased.  Got a new pillow.  Not having to use muscle relaxer.  Pt has appt with Dr. Redmond Pulling, general surgeon who did his lap band in 2013.   Just wanted to check in with surgeon, not experiencing any issues with the band.  Prior to lap band pt  weighed 350 lbs, got down to 240 lbs.  Got rid of snacks in house.  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  Requesting refill on sildenafil.  ROS: See pertinent positives and negatives per HPI.  Past Medical History:  Diagnosis Date   Allergy    seasonal   ERECTILE DYSFUNCTION 08/21/2008   HYPERTENSION 05/10/2007   Morbid obesity ([place])    Pure hypercholesterolemia 04/24/2008    Past Surgical History:  Procedure Laterality Date   HIATAL HERNIA REPAIR  07/22/2012   Procedure: HERNIA REPAIR HIATAL;  Surgeon: Gayland Curry, MD,FACS;  Location: [facility];  Service: General;  Laterality: N/A;   LAPAROSCOPIC GASTRIC BANDING  07/22/2012   Procedure: LAPAROSCOPIC GASTRIC BANDING;   Surgeon: Gayland Curry, MD,FACS;  Location: [facility];  Service: General;  Laterality: N/A;   WISDOM TOOTH EXTRACTION      Family History  Problem Relation Age of Onset   Breast cancer Maternal Grandmother    Prostate cancer Paternal Grandfather    Colon cancer Neg Hx    Colon polyps Neg Hx    Esophageal cancer Neg Hx    Rectal cancer Neg Hx    Stomach cancer Neg Hx       Current Outpatient Medications:    atorvastatin (LIPITOR) 80 MG tablet, Take 1 tablet by mouth once daily, Disp: 90 tablet, Rfl: 0   cyclobenzaprine (FLEXERIL) 5 MG tablet, TAKE 1 TABLET BY MOUTH AT BEDTIME AS NEEDED FOR MUSCLE SPASM, Disp: 30 tablet, Rfl: 0   fluticasone (FLONASE) 50 MCG/ACT nasal spray, USE ONE SPRAY IN EACH NOSTRIL EVERY DAY, Disp: 16 g, Rfl: 5   loratadine (CLARITIN) 10 MG tablet, Take 10 mg by mouth daily as needed., Disp: , Rfl:    olmesartan-hydrochlorothiazide (BENICAR HCT) 20-12.5 MG tablet, Take 1 tablet by mouth daily., Disp: 90 tablet, Rfl: 3   sertraline (ZOLOFT) 25 MG tablet, Take 1 tablet (25 mg total) by mouth daily., Disp: 30 tablet, Rfl: 3   sildenafil (REVATIO) 20 MG tablet, 2-5 tablets  Daily as needed.  For ED (Patient taking differently: 2-5 tablets  Daily as needed.  For ED), Disp: 90 tablet, Rfl: 4   triamcinolone (NASACORT)  55 MCG/ACT AERO nasal inhaler, Place 2 sprays into the nose daily., Disp: , Rfl:    triamcinolone ointment (KENALOG) 0.1 %, Apply 1 application topically 2 (two) times daily., Disp: 453.6 g, Rfl: 1   Vitamin D, Ergocalciferol, (DRISDOL) 1.25 MG (50000 UNIT) CAPS capsule, Take 1 capsule (50,000 Units total) by mouth every 7 (seven) days. (Patient not taking: Reported on 11/15/2022), Disp: 12 capsule, Rfl: 0  Current Facility-Administered Medications:    0.9 %  sodium chloride infusion, 500 mL, Intravenous, Once, Pyrtle, Lajuan Lines, MD  EXAMTonette Bihari per patient if applicable: RR between 123456 bpm  GENERAL: alert, oriented, appears well and in no acute  distress  HEENT: atraumatic, conjunctiva clear, no obvious abnormalities on inspection of external nose and ears  NECK: normal movements of the head and neck  LUNGS: on inspection no signs of respiratory distress, breathing rate appears normal, no obvious gross SOB, gasping or wheezing  CV: no obvious cyanosis  MS: moves all visible extremities without noticeable abnormality  PSYCH/NEURO: pleasant and cooperative, no obvious depression or anxiety, speech and thought processing grossly intact     11/15/2022    4:00 PM 06/15/2022   10:40 AM 12/14/2021    2:13 PM 07/11/2021    9:06 AM  GAD 7 : Generalized Anxiety Score  Nervous, Anxious, on Edge '1 3 2 1  '$ Control/stop worrying 0 '3 2 1  '$ Worry too much - different things 0 '3 2 1  '$ Trouble relaxing 0 2 2 0  Restless 0 2 3 0  Easily annoyed or irritable 0 0 0 0  Afraid - awful might happen 0 '3 3 1  '$ Total GAD 7 Score '1 16 14 4  '$ Anxiety Difficulty Not difficult at all Somewhat difficult Somewhat difficult Not difficult at all       11/15/2022    3:59 PM 06/15/2022   10:37 AM 12/14/2021    1:20 PM  Depression screen PHQ 2/9  Decreased Interest 0 1 2  Down, Depressed, Hopeless 0 1 2  PHQ - 2 Score 0 2 4  Altered sleeping 0 1 1  Tired, decreased energy 0 2 1  Change in appetite 0 2 1  Feeling bad or failure about yourself  0 2 1  Trouble concentrating 0 2 0  Moving slowly or fidgety/restless 0 1 0  Suicidal thoughts 0 0 0  PHQ-9 Score 0 12 8  Difficult doing work/chores Not difficult at all Very difficult Somewhat difficult    ASSESSMENT AND PLAN:  Discussed the following assessment and plan:  GAD (generalized anxiety disorder) -improved -GAD 7 score 1 this visit.  Previously 16 on 06/15/22 -continue self care -continue zoloft 25 mg daily  - Plan: sertraline (ZOLOFT) 25 MG tablet  Depression, major, single episode, mild (HCC) -improving -PHQ 9 socre this visit 0, previously 12 on 06/15/22.  - Plan: sertraline (ZOLOFT)  25 MG tablet  Erectile dysfunction, unspecified erectile dysfunction type  - Plan: sildenafil (REVATIO) 20 MG tablet  Personal history of gastric banding -stable -continue lifestyle modifications  F/u prn in the next few months for CPE.   I discussed the assessment and treatment plan with the patient. The patient was provided an opportunity to ask questions and all were answered. The patient agreed with the plan and demonstrated an understanding of the instructions.   The patient was advised to call back or seek an in-person evaluation if the symptoms worsen or if the condition fails to improve as anticipated.  [name], MD

## 2023-02-15 ENCOUNTER — Other Ambulatory Visit: Payer: Self-pay | Admitting: Family Medicine

## 2023-02-15 DIAGNOSIS — E782 Mixed hyperlipidemia: Secondary | ICD-10-CM

## 2023-05-06 ENCOUNTER — Other Ambulatory Visit: Payer: Self-pay | Admitting: Family Medicine

## 2023-05-06 DIAGNOSIS — F320 Major depressive disorder, single episode, mild: Secondary | ICD-10-CM

## 2023-05-06 DIAGNOSIS — F411 Generalized anxiety disorder: Secondary | ICD-10-CM

## 2023-06-10 ENCOUNTER — Other Ambulatory Visit: Payer: Self-pay | Admitting: Family Medicine

## 2023-06-10 DIAGNOSIS — F320 Major depressive disorder, single episode, mild: Secondary | ICD-10-CM

## 2023-06-10 DIAGNOSIS — F411 Generalized anxiety disorder: Secondary | ICD-10-CM

## 2023-06-14 ENCOUNTER — Other Ambulatory Visit: Payer: Self-pay | Admitting: Family Medicine

## 2023-06-14 DIAGNOSIS — E782 Mixed hyperlipidemia: Secondary | ICD-10-CM

## 2023-07-10 ENCOUNTER — Other Ambulatory Visit: Payer: Self-pay | Admitting: Family Medicine

## 2023-07-10 DIAGNOSIS — F411 Generalized anxiety disorder: Secondary | ICD-10-CM

## 2023-07-10 DIAGNOSIS — F320 Major depressive disorder, single episode, mild: Secondary | ICD-10-CM

## 2023-07-12 ENCOUNTER — Ambulatory Visit: Payer: BC Managed Care – PPO | Admitting: Family Medicine

## 2023-07-12 ENCOUNTER — Encounter: Payer: Self-pay | Admitting: Family Medicine

## 2023-07-12 VITALS — BP 120/80 | HR 95 | Temp 99.2°F | Ht 66.5 in | Wt 301.0 lb

## 2023-07-12 DIAGNOSIS — E782 Mixed hyperlipidemia: Secondary | ICD-10-CM | POA: Diagnosis not present

## 2023-07-12 DIAGNOSIS — R058 Other specified cough: Secondary | ICD-10-CM

## 2023-07-12 DIAGNOSIS — I10 Essential (primary) hypertension: Secondary | ICD-10-CM | POA: Diagnosis not present

## 2023-07-12 DIAGNOSIS — Z9884 Bariatric surgery status: Secondary | ICD-10-CM | POA: Diagnosis not present

## 2023-07-12 DIAGNOSIS — Z125 Encounter for screening for malignant neoplasm of prostate: Secondary | ICD-10-CM

## 2023-07-12 DIAGNOSIS — T464X5A Adverse effect of angiotensin-converting-enzyme inhibitors, initial encounter: Secondary | ICD-10-CM

## 2023-07-12 DIAGNOSIS — N529 Male erectile dysfunction, unspecified: Secondary | ICD-10-CM

## 2023-07-12 DIAGNOSIS — F320 Major depressive disorder, single episode, mild: Secondary | ICD-10-CM

## 2023-07-12 DIAGNOSIS — R635 Abnormal weight gain: Secondary | ICD-10-CM

## 2023-07-12 DIAGNOSIS — F411 Generalized anxiety disorder: Secondary | ICD-10-CM

## 2023-07-12 MED ORDER — OZEMPIC (0.25 OR 0.5 MG/DOSE) 2 MG/3ML ~~LOC~~ SOPN: 0.5000 mg | PEN_INJECTOR | SUBCUTANEOUS | 1 refills | Status: DC

## 2023-07-12 MED ORDER — OLMESARTAN MEDOXOMIL-HCTZ 20-12.5 MG PO TABS: 1.0000 | ORAL_TABLET | Freq: Every day | ORAL | 3 refills | Status: DC

## 2023-07-12 MED ORDER — SEMAGLUTIDE(0.25 OR 0.5MG/DOS) 2 MG/3ML ~~LOC~~ SOPN: 0.2500 mg | PEN_INJECTOR | SUBCUTANEOUS | 0 refills | Status: DC

## 2023-07-12 MED ORDER — SERTRALINE HCL 25 MG PO TABS: 25.0000 mg | ORAL_TABLET | Freq: Every day | ORAL | 1 refills | Status: DC

## 2023-07-12 MED ORDER — ATORVASTATIN CALCIUM 80 MG PO TABS: ORAL_TABLET | ORAL | 3 refills | Status: DC

## 2023-07-12 MED ORDER — SILDENAFIL CITRATE 20 MG PO TABS: ORAL_TABLET | ORAL | 4 refills | Status: AC

## 2023-07-12 NOTE — Patient Instructions (Addendum)
A prescription for semaglutide 0.25 mg weekly with sent to your pharmacy.  If your insurance approves the medication you can pick it up and start taking it for the next month.  After that month the second prescription for 0.5 mg with also sent to your pharmacy.  I placed orders for labs to check your kidney function before starting the medication.  You can have these done at the clinic or [facility] lab.

## 2023-07-12 NOTE — Progress Notes (Signed)
Established Patient Office Visit   Subjective  Patient ID: Adam Adkins, male    DOB: [birth year]  Age: 52 y.o. MRN: [id number]  Chief Complaint  Patient presents with   Medication Refill    Pt is a 52 yo male seen for f/u and med management.  Doing well on Zoloft 25 mg daily, "in a good place".  Endorses wt gain.  Now 301lbs was 280s several months ago.  Pt with h/o lap band in 2013, weighed 350 lbs at time of surgery.  Got down to 240s lbs.  Pt concerned and wants to get better control of wt. pt afraid continued weight loss will affect mood.  Has difficulty with some exercises due to h/o back pain.  Exercise bike puts less strain on back.  Patient states by the time he leaves 12-hour shift he is tired.  Eating 1 meal, usually dinner as typically does not eat breakfast and may forget to eat lunch.  Patient requesting refills on meds.    Patient Active Problem List   Diagnosis Date Noted   Sarcoidosis 05/15/2016   Retroperitoneal lymphadenopathy 02/08/2015   Pulmonary nodules/lesions, multiple 02/08/2015   Hx of laparoscopic adjustable gastric banding 08/21/2012   Obesity, Class III, BMI 40-49.9 (morbid obesity) (HCC) 05/02/2012   ERECTILE DYSFUNCTION 08/21/2008   Pure hypercholesterolemia 04/24/2008   Essential hypertension 05/10/2007   Past Medical History:  Diagnosis Date   Allergy    seasonal   ERECTILE DYSFUNCTION 08/21/2008   HYPERTENSION 05/10/2007   Morbid obesity (HCC)    Pure hypercholesterolemia 04/24/2008   Past Surgical History:  Procedure Laterality Date   HIATAL HERNIA REPAIR  07/22/2012   Procedure: HERNIA REPAIR HIATAL;  Surgeon: Atilano Ina, MD,FACS;  Location: [facility];  Service: General;  Laterality: N/A;   LAPAROSCOPIC GASTRIC BANDING  07/22/2012   Procedure: LAPAROSCOPIC GASTRIC BANDING;  Surgeon: Atilano Ina, MD,FACS;  Location: [facility];  Service: General;  Laterality: N/A;   WISDOM TOOTH EXTRACTION     Social History   Tobacco Use   Smoking  status: Former    Current packs/day: 0.00    Average packs/day: 0.3 packs/day for 10.0 years (3.0 ttl pk-yrs)    Types: Cigarettes    Start date: 09/04/1988    Quit date: 09/04/1998    Years since quitting: 24.8   Smokeless tobacco: Never  Vaping Use   Vaping status: Never Used  Substance Use Topics   Alcohol use: No   Drug use: No   Family History  Problem Relation Age of Onset   Breast cancer Maternal Grandmother    Prostate cancer Paternal Grandfather    Colon cancer Neg Hx    Colon polyps Neg Hx    Esophageal cancer Neg Hx    Rectal cancer Neg Hx    Stomach cancer Neg Hx    Allergies  Allergen Reactions   Lisinopril Cough    Dry cough/throat clearing      ROS Negative unless stated above    Objective:     BP 120/80 (BP Location: Right Arm, Patient Position: Sitting, Cuff Size: Large)   Pulse 95   Temp 99.2 F (37.3 C) (Oral)   Ht 5' 6.5" (1.689 m)   Wt (!) 301 lb (136.5 kg)   SpO2 97%   BMI 47.85 kg/m  BP Readings from Last 3 Encounters:  07/12/23 120/80  11/15/22 124/85  06/15/22 124/68   Wt Readings from Last 3 Encounters:  07/12/23 (!) 301 lb (136.5 kg)  11/15/22 285 lb (129.3 kg)  06/15/22 285 lb 12.8 oz (129.6 kg)      Physical Exam Constitutional:      General: He is not in acute distress.    Appearance: Normal appearance.  HENT:     Head: Normocephalic and atraumatic.     Nose: Nose normal.     Mouth/Throat:     Mouth: Mucous membranes are moist.  Eyes:     Extraocular Movements: Extraocular movements intact.     Conjunctiva/sclera: Conjunctivae normal.  Cardiovascular:     Rate and Rhythm: Normal rate and regular rhythm.     Heart sounds: Normal heart sounds. No murmur heard.    No gallop.  Pulmonary:     Effort: Pulmonary effort is normal.     Breath sounds: Normal breath sounds.  Skin:    General: Skin is warm and dry.  Neurological:     Mental Status: He is alert and oriented to person, place, and time. Mental status is at  baseline.       07/12/2023   11:54 AM 11/15/2022    3:59 PM 06/15/2022   10:37 AM  Depression screen PHQ 2/9  Decreased Interest 0 0 1  Down, Depressed, Hopeless 0 0 1  PHQ - 2 Score 0 0 2  Altered sleeping 0 0 1  Tired, decreased energy 0 0 2  Change in appetite 1 0 2  Feeling bad or failure about yourself  0 0 2  Trouble concentrating 0 0 2  Moving slowly or fidgety/restless 0 0 1  Suicidal thoughts 0 0 0  PHQ-9 Score 1 0 12  Difficult doing work/chores Not difficult at all Not difficult at all Very difficult      07/12/2023   11:54 AM 11/15/2022    4:00 PM 06/15/2022   10:40 AM 12/14/2021    2:13 PM  GAD 7 : Generalized Anxiety Score  Nervous, Anxious, on Edge 0 1 3 2   Control/stop worrying 0 0 3 2  Worry too much - different things 0 0 3 2  Trouble relaxing 0 0 2 2  Restless 0 0 2 3  Easily annoyed or irritable 0 0 0 0  Afraid - awful might happen 0 0 3 3  Total GAD 7 Score 0 1 16 14   Anxiety Difficulty Not difficult at all Not difficult at all Somewhat difficult Somewhat difficult      No results found for any visits on 07/12/23.    Assessment & Plan:  Obesity, Class III, BMI 40-49.9 (morbid obesity) (HCC) -Body mass index is 47.85 kg/m. -s/p gastric band -Continue lifestyle modifications.  Patient encouraged to eat more than once per day.  Ensure adequate intake of protein. Obtain labs.  Patient to check with insurance about coverage for Ozempic or other weight loss medication options. -     Semaglutide(0.25 or 0.5MG /DOS); Inject 0.25 mg into the skin once a week.  Dispense: 3 mL; Refill: 0 -     Ozempic (0.25 or 0.5 MG/DOSE); Inject 0.5 mg into the skin once a week.  Dispense: 3 mL; Refill: 1 -     Comprehensive metabolic panel; Future -     TSH; Future -     T4, free; Future -     CBC with Differential/Platelet; Future -     Hemoglobin A1c; Future -     VITAMIN D 25 Hydroxy (Vit-D Deficiency, Fractures); Future  History of laparoscopic adjustable  gastric banding  Mixed hyperlipidemia -lifestyle modifications -  Atorvastatin Calcium; TAKE 1 TABLET BY MOUTH ONCE DAILY .  Dispense: 90 tablet; Refill: 3 -     Lipid panel; Future  GAD (generalized anxiety disorder) -GAD-7 score 0 this visit -     Sertraline HCl; Take 1 tablet (25 mg total) by mouth daily.  Dispense: 90 tablet; Refill: 1 -     TSH; Future -     T4, free; Future  Depression, major, single episode, mild (HCC) -PHQ-9 score 1 this visit -     Sertraline HCl; Take 1 tablet (25 mg total) by mouth daily.  Dispense: 90 tablet; Refill: 1 -     TSH; Future -     T4, free; Future  Erectile dysfunction, unspecified erectile dysfunction type -     Sildenafil Citrate; 2-5 tablets  Daily as needed.  For ED  Dispense: 90 tablet; Refill: 4  Weight gain -s/p Lap-Band -16 pound weight gain since March 2024.  Likely 2/2 Zoloft -     Semaglutide(0.25 or 0.5MG /DOS); Inject 0.25 mg into the skin once a week.  Dispense: 3 mL; Refill: 0 -     Ozempic (0.25 or 0.5 MG/DOSE); Inject 0.5 mg into the skin once a week.  Dispense: 3 mL; Refill: 1 -     TSH; Future -     T4, free; Future -     Hemoglobin A1c; Future -     VITAMIN D 25 Hydroxy (Vit-D Deficiency, Fractures); Future  Screening for prostate cancer       -     PSA  Essential hypertension -Controlled -Continue lifestyle modifications -Continue olmesartan-hydrochlorothiazide 20-12.5 mg daily  Return in about 5 weeks (around 08/16/2023).  Patient will have labs next week in clinic as labs unavailable this visit due to staffing.  Deeann Saint, MD

## 2023-07-19 ENCOUNTER — Other Ambulatory Visit (HOSPITAL_COMMUNITY): Payer: Self-pay

## 2023-07-19 ENCOUNTER — Telehealth: Payer: Self-pay | Admitting: Pharmacist

## 2023-07-19 NOTE — Telephone Encounter (Signed)
Patient does not have documented diagnosis of Type 2 diabetes; therefore Ozempic would not be approved for use in this patient.  If clinically appropriate, you could consider Wegovy, Zepbound or Saxenda, all of which are approved for weight loss.  Dellie Burns, PharmD Clinical Pharmacist [place]  Direct Dial: [phone]

## 2023-07-26 ENCOUNTER — Other Ambulatory Visit: Payer: Self-pay | Admitting: Family Medicine

## 2023-07-26 MED ORDER — SEMAGLUTIDE-WEIGHT MANAGEMENT 1 MG/0.5ML ~~LOC~~ SOAJ: 1.0000 mg | SUBCUTANEOUS | 1 refills | Status: AC

## 2023-07-26 MED ORDER — SEMAGLUTIDE-WEIGHT MANAGEMENT 0.25 MG/0.5ML ~~LOC~~ SOAJ: 0.2500 mg | SUBCUTANEOUS | 0 refills | Status: AC

## 2023-07-26 MED ORDER — SEMAGLUTIDE-WEIGHT MANAGEMENT 0.5 MG/0.5ML ~~LOC~~ SOAJ: 0.5000 mg | SUBCUTANEOUS | 1 refills | Status: AC

## 2023-07-26 NOTE — Telephone Encounter (Signed)
Spoke with patient he is aware

## 2023-07-26 NOTE — Telephone Encounter (Signed)
Rx for Wegovy sent to pharmacy

## 2023-08-14 ENCOUNTER — Telehealth: Payer: Self-pay

## 2023-08-14 ENCOUNTER — Other Ambulatory Visit (HOSPITAL_COMMUNITY): Payer: Self-pay

## 2023-08-14 NOTE — Telephone Encounter (Signed)
Ozempic is only approved for type 2 diabetes. Per telephone encounter on 07/19/23, prescription changed to [facility]. Submitted PA for [employer]. Please see telephone encounter on 08/14/23.

## 2023-08-14 NOTE — Telephone Encounter (Signed)
Pharmacy Patient Advocate Encounter   Received notification from Pt Calls Messages that prior authorization for [facility] 0.25MG /0.5ML auto-injectors is required/requested.   Insurance verification completed.   The patient is insured through [employer] .   Per test claim: PA required and submitted KEY/EOC/Request #: [id number] CANCELLED due to: Your PA has been resolved, no additional PA is required. For further inquiries please contact the number on the back of the member prescription card.   Ran test claim, received paid claim, but patient's copay is $1,294.35 for 28 day supply.

## 2023-08-16 ENCOUNTER — Telehealth: Payer: Self-pay | Admitting: Family Medicine

## 2023-08-16 NOTE — Telephone Encounter (Signed)
error 

## 2023-09-07 ENCOUNTER — Other Ambulatory Visit (HOSPITAL_COMMUNITY): Payer: Self-pay

## 2023-12-31 ENCOUNTER — Ambulatory Visit: Payer: Self-pay | Admitting: Family Medicine

## 2023-12-31 ENCOUNTER — Encounter: Payer: Self-pay | Admitting: Family Medicine

## 2023-12-31 ENCOUNTER — Other Ambulatory Visit: Payer: Self-pay | Admitting: Family Medicine

## 2023-12-31 ENCOUNTER — Telehealth: Payer: Self-pay

## 2023-12-31 VITALS — BP 142/84 | HR 125 | Temp 98.6°F | Ht 66.5 in | Wt 283.0 lb

## 2023-12-31 DIAGNOSIS — F320 Major depressive disorder, single episode, mild: Secondary | ICD-10-CM

## 2023-12-31 DIAGNOSIS — E782 Mixed hyperlipidemia: Secondary | ICD-10-CM | POA: Diagnosis not present

## 2023-12-31 DIAGNOSIS — A630 Anogenital (venereal) warts: Secondary | ICD-10-CM

## 2023-12-31 DIAGNOSIS — E66813 Obesity, class 3: Secondary | ICD-10-CM | POA: Diagnosis not present

## 2023-12-31 DIAGNOSIS — E559 Vitamin D deficiency, unspecified: Secondary | ICD-10-CM

## 2023-12-31 DIAGNOSIS — F411 Generalized anxiety disorder: Secondary | ICD-10-CM

## 2023-12-31 DIAGNOSIS — R635 Abnormal weight gain: Secondary | ICD-10-CM

## 2023-12-31 DIAGNOSIS — Z125 Encounter for screening for malignant neoplasm of prostate: Secondary | ICD-10-CM | POA: Diagnosis not present

## 2023-12-31 DIAGNOSIS — Z6841 Body Mass Index (BMI) 40.0 and over, adult: Secondary | ICD-10-CM

## 2023-12-31 LAB — CBC WITH DIFFERENTIAL/PLATELET
Basophils Absolute: 0.1 10*3/uL (ref 0.0–0.1)
Basophils Relative: 0.9 % (ref 0.0–3.0)
Eosinophils Absolute: 0.1 10*3/uL (ref 0.0–0.7)
Eosinophils Relative: 1.6 % (ref 0.0–5.0)
HCT: 45.8 % (ref 39.0–52.0)
Hemoglobin: 15.5 g/dL (ref 13.0–17.0)
Lymphocytes Relative: 21 % (ref 12.0–46.0)
Lymphs Abs: 1.7 10*3/uL (ref 0.7–4.0)
MCHC: 33.8 g/dL (ref 30.0–36.0)
MCV: 92.9 fl (ref 78.0–100.0)
Monocytes Absolute: 0.7 10*3/uL (ref 0.1–1.0)
Monocytes Relative: 9 % (ref 3.0–12.0)
Neutro Abs: 5.4 10*3/uL (ref 1.4–7.7)
Neutrophils Relative %: 67.5 % (ref 43.0–77.0)
Platelets: 243 10*3/uL (ref 150.0–400.0)
RBC: 4.93 Mil/uL (ref 4.22–5.81)
RDW: 13.9 % (ref 11.5–15.5)
WBC: 8 10*3/uL (ref 4.0–10.5)

## 2023-12-31 LAB — COMPREHENSIVE METABOLIC PANEL WITH GFR
ALT: 31 U/L (ref 0–53)
AST: 27 U/L (ref 0–37)
Albumin: 4.3 g/dL (ref 3.5–5.2)
Alkaline Phosphatase: 59 U/L (ref 39–117)
BUN: 14 mg/dL (ref 6–23)
CO2: 28 meq/L (ref 19–32)
Calcium: 9.7 mg/dL (ref 8.4–10.5)
Chloride: 99 meq/L (ref 96–112)
Creatinine, Ser: 1.12 mg/dL (ref 0.40–1.50)
GFR: 74.14 mL/min (ref >60.00)
Glucose, Bld: 83 mg/dL (ref 70–99)
Potassium: 4.3 meq/L (ref 3.5–5.1)
Sodium: 136 meq/L (ref 135–145)
Total Bilirubin: 1 mg/dL (ref 0.2–1.2)
Total Protein: 7.3 g/dL (ref 6.0–8.3)

## 2023-12-31 LAB — LIPID PANEL
Cholesterol: 203 mg/dL — ABNORMAL HIGH (ref 0–200)
HDL: 37 mg/dL — ABNORMAL LOW (ref >39.00)
LDL Cholesterol: 143 mg/dL — ABNORMAL HIGH (ref 0–99)
NonHDL: 165.79
Total CHOL/HDL Ratio: 5
Triglycerides: 114 mg/dL (ref 0.0–149.0)
VLDL: 22.8 mg/dL (ref 0.0–40.0)

## 2023-12-31 LAB — VITAMIN D 25 HYDROXY (VIT D DEFICIENCY, FRACTURES): VITD: 23.59 ng/mL — ABNORMAL LOW (ref 30.00–100.00)

## 2023-12-31 LAB — PSA: PSA: 0.61 ng/mL (ref 0.10–4.00)

## 2023-12-31 LAB — T4, FREE: Free T4: 0.8 ng/dL (ref 0.60–1.60)

## 2023-12-31 LAB — TSH: TSH: 0.67 u[IU]/mL (ref 0.35–5.50)

## 2023-12-31 LAB — HEMOGLOBIN A1C: Hgb A1c MFr Bld: 5.8 % (ref 4.6–6.5)

## 2023-12-31 MED ORDER — SERTRALINE HCL 25 MG PO TABS: 25.0000 mg | ORAL_TABLET | Freq: Every day | ORAL | 1 refills | Status: DC

## 2023-12-31 MED ORDER — TIRZEPATIDE-WEIGHT MANAGEMENT 5 MG/0.5ML ~~LOC~~ SOLN: 5.0000 mg | SUBCUTANEOUS | 1 refills | Status: DC

## 2023-12-31 MED ORDER — VITAMIN D (ERGOCALCIFEROL) 1.25 MG (50000 UNIT) PO CAPS
50000.0000 [IU] | ORAL_CAPSULE | ORAL | 0 refills | Status: AC
Start: 2023-12-31 — End: ?

## 2023-12-31 NOTE — Telephone Encounter (Signed)
 [name] is approved exclusively as an adjunct to diet and exercise to improve glycemic control in adults with type 2 diabetes mellitus. A review of patient's medical chart reveals no documented diagnosis of type 2 diabetes or an A1C indicative of diabetes. Therefore, they do not currently meet the criteria for prior authorization of this medication. If clinically appropriate, alternative options such as Saxenda, Zepbound, or Wegovy  may be considered for this patient.  [employer] KEY: [id number]

## 2023-12-31 NOTE — Progress Notes (Signed)
 Established Patient Office Visit   Subjective  Patient ID: Adam Adkins, male    DOB: 01-13-1971  Age: 53 y.o. MRN: [id number]  Chief Complaint  Patient presents with   Follow-up    Labs, Zoloft       Pt is a 53 yo male seen for f/u.  Pt requesting refill on Zoloft .  Finds med helpful.  Pt noticed a non pruritic rash on penis x 1 wk.  Denies changes in soaps, lotions, detergents.  Pt has photo of rash.  Pt has on had 1 sexual partner and has been faithfully married x 30+yrs.    Pt on Zepbound. Obtained through [employer].  Notes wt loss.  Inquires if insurance will now cover cost via pcp.    Patient Active Problem List   Diagnosis Date Noted   Sarcoidosis 05/15/2016   Retroperitoneal lymphadenopathy 02/08/2015   Pulmonary nodules/lesions, multiple 02/08/2015   Hx of laparoscopic adjustable gastric banding 08/21/2012   Obesity, Class III, BMI 40-49.9 (morbid obesity) (HCC) 05/02/2012   ERECTILE DYSFUNCTION 08/21/2008   Pure hypercholesterolemia 04/24/2008   Essential hypertension 05/10/2007   Past Medical History:  Diagnosis Date   Allergy    seasonal   ERECTILE DYSFUNCTION 08/21/2008   HYPERTENSION 05/10/2007   Morbid obesity (HCC)    Pure hypercholesterolemia 04/24/2008   Past Surgical History:  Procedure Laterality Date   HIATAL HERNIA REPAIR  07/22/2012   Procedure: HERNIA REPAIR HIATAL;  Surgeon: Fran Imus, MD,FACS;  Location: [facility];  Service: General;  Laterality: N/A;   LAPAROSCOPIC GASTRIC BANDING  07/22/2012   Procedure: LAPAROSCOPIC GASTRIC BANDING;  Surgeon: Fran Imus, MD,FACS;  Location: [facility];  Service: General;  Laterality: N/A;   WISDOM TOOTH EXTRACTION     Family History  Problem Relation Age of Onset   Breast cancer Maternal Grandmother    Prostate cancer Paternal Grandfather    Colon cancer Neg Hx    Colon polyps Neg Hx    Esophageal cancer Neg Hx    Rectal cancer Neg Hx    Stomach cancer Neg Hx    Allergies  Allergen Reactions    Lisinopril  Cough    Dry cough/throat clearing      ROS Negative unless stated above    Objective:     BP (!) 142/84 (BP Location: Left Arm, Patient Position: Sitting, Cuff Size: Normal)   Pulse (!) 125   Temp 98.6 F (37 C) (Oral)   Ht 5' 6.5" (1.689 m)   Wt 283 lb (128.4 kg)   SpO2 96%   BMI 44.99 kg/m  BP Readings from Last 3 Encounters:  12/31/23 (!) 142/84  07/12/23 120/80  11/15/22 124/85   Wt Readings from Last 3 Encounters:  12/31/23 283 lb (128.4 kg)  07/12/23 (!) 301 lb (136.5 kg)  11/15/22 285 lb (129.3 kg)     Physical Exam Constitutional:      General: He is not in acute distress.    Appearance: Normal appearance.  HENT:     Head: Normocephalic and atraumatic.     Nose: Nose normal.     Mouth/Throat:     Mouth: Mucous membranes are moist.  Cardiovascular:     Rate and Rhythm: Normal rate and regular rhythm.     Heart sounds: Normal heart sounds. No murmur heard.    No gallop.  Pulmonary:     Effort: Pulmonary effort is normal. No respiratory distress.     Breath sounds: Normal breath sounds. No wheezing, rhonchi  or rales.  Genitourinary:    Comments: Photo of skin lesions viewed on pt's phone:  clusters of flesh toned skin tag like lesions on glans penis.  No erythema, drainage, or pustules. Skin:    General: Skin is warm and dry.  Neurological:     Mental Status: He is alert and oriented to person, place, and time.       12/31/2023    2:55 PM 07/12/2023   11:54 AM 11/15/2022    3:59 PM  Depression screen PHQ 2/9  Decreased Interest 0 0 0  Down, Depressed, Hopeless 0 0 0  PHQ - 2 Score 0 0 0  Altered sleeping 0 0 0  Tired, decreased energy 0 0 0  Change in appetite 0 1 0  Feeling bad or failure about yourself  0 0 0  Trouble concentrating 0 0 0  Moving slowly or fidgety/restless 0 0 0  Suicidal thoughts 0 0 0  PHQ-9 Score 0 1 0  Difficult doing work/chores Not difficult at all Not difficult at all Not difficult at all      12/31/2023     2:56 PM 07/12/2023   11:54 AM 11/15/2022    4:00 PM 06/15/2022   10:40 AM  GAD 7 : Generalized Anxiety Score  Nervous, Anxious, on Edge 1 0 1 3  Control/stop worrying 0 0 0 3  Worry too much - different things 0 0 0 3  Trouble relaxing 0 0 0 2  Restless 0 0 0 2  Easily annoyed or irritable 0 0 0 0  Afraid - awful might happen 0 0 0 3  Total GAD 7 Score 1 0 1 16  Anxiety Difficulty Not difficult at all Not difficult at all Not difficult at all Somewhat difficult   No results found for any visits on 12/31/23.    Assessment & Plan:  Condylomata acuminata -     Ambulatory referral to Dermatology  GAD (generalized anxiety disorder) -     Sertraline  HCl; Take 1 tablet (25 mg total) by mouth daily.  Dispense: 90 tablet; Refill: 1 -     T4, free -     TSH  Depression, major, single episode, mild (HCC) -     Sertraline  HCl; Take 1 tablet (25 mg total) by mouth daily.  Dispense: 90 tablet; Refill: 1 -     T4, free -     TSH  Class 3 severe obesity with serious comorbidity and body mass index (BMI) of 40.0 to 44.9 in adult, unspecified obesity type -     Tirzepatide-Weight Management; Inject 5 mg into the skin once a week.  Dispense: 2 mL; Refill: 1  Screening for prostate cancer -     PSA  Obesity, Class III, BMI 40-49.9 (morbid obesity) -     VITAMIN D  25 Hydroxy (Vit-D Deficiency, Fractures) -     Hemoglobin A1c -     CBC with Differential/Platelet -     T4, free -     TSH -     Comprehensive metabolic panel with GFR  Weight gain -     VITAMIN D  25 Hydroxy (Vit-D Deficiency, Fractures) -     Hemoglobin A1c -     T4, free -     TSH  Mixed hyperlipidemia -     Lipid panel  Skin lesions concerning for condyloma.  Discussed cause and treatment options including cryotherapy and excision.  Referral to Derm placed.  BMI 44.99 kg/m^2.  H/o gastric  surgery.  Continue Zepbound.  Rx sent to pharmacy.  Continue lifestyle modifications.  Return in about 4 months (around  05/01/2024), or if symptoms worsen or fail to improve.   Viola Greulich, MD

## 2024-01-07 ENCOUNTER — Telehealth: Payer: Self-pay

## 2024-01-07 NOTE — Telephone Encounter (Signed)
 Copied from CRM [id number]. Topic: General - Other >> Jan 04, 2024  4:31 PM Tiffany S wrote: Reason for CRM: Patient wants to see if he can get prescription discuss in last visit since he is not able to see dermatologist until June please follow up with patient

## 2024-01-07 NOTE — Telephone Encounter (Signed)
 The most recent prescription was written for generic Tirzepatide for obesity not DM.  Pt is working on diet and exercise and has a h/o gastric surgery.

## 2024-01-10 NOTE — Telephone Encounter (Signed)
 Can see if patient can be seen sooner at a different dermatology office.

## 2024-03-19 ENCOUNTER — Other Ambulatory Visit: Payer: Self-pay | Admitting: Family Medicine

## 2024-03-19 DIAGNOSIS — E559 Vitamin D deficiency, unspecified: Secondary | ICD-10-CM

## 2024-04-10 ENCOUNTER — Telehealth: Payer: Self-pay

## 2024-04-10 NOTE — Telephone Encounter (Signed)
 Left voicemail to call office back to see if referral was completed.   Curtistine Quiet, CMA

## 2024-06-22 ENCOUNTER — Other Ambulatory Visit: Payer: Self-pay | Admitting: Family Medicine

## 2024-06-22 DIAGNOSIS — F411 Generalized anxiety disorder: Secondary | ICD-10-CM

## 2024-06-22 DIAGNOSIS — F320 Major depressive disorder, single episode, mild: Secondary | ICD-10-CM

## 2024-07-16 ENCOUNTER — Encounter: Payer: Self-pay | Admitting: Family Medicine

## 2024-07-16 ENCOUNTER — Telehealth (INDEPENDENT_AMBULATORY_CARE_PROVIDER_SITE_OTHER): Admitting: Family Medicine

## 2024-07-16 DIAGNOSIS — Z6838 Body mass index (BMI) 38.0-38.9, adult: Secondary | ICD-10-CM | POA: Diagnosis not present

## 2024-07-16 DIAGNOSIS — R058 Other specified cough: Secondary | ICD-10-CM

## 2024-07-16 DIAGNOSIS — Z9884 Bariatric surgery status: Secondary | ICD-10-CM | POA: Diagnosis not present

## 2024-07-16 DIAGNOSIS — I10 Essential (primary) hypertension: Secondary | ICD-10-CM

## 2024-07-16 DIAGNOSIS — E66812 Obesity, class 2: Secondary | ICD-10-CM

## 2024-07-16 DIAGNOSIS — T464X5A Adverse effect of angiotensin-converting-enzyme inhibitors, initial encounter: Secondary | ICD-10-CM

## 2024-07-16 MED ORDER — ZEPBOUND 15 MG/0.5ML ~~LOC~~ SOAJ: 15.0000 mg | SUBCUTANEOUS | 3 refills | Status: DC

## 2024-07-16 MED ORDER — OLMESARTAN MEDOXOMIL-HCTZ 20-12.5 MG PO TABS
0.5000 | ORAL_TABLET | Freq: Every day | ORAL | 3 refills | Status: AC
Start: 2024-07-16 — End: ?

## 2024-07-16 NOTE — Progress Notes (Signed)
 Virtual Visit via Video Note  I connected with Adam Adkins on 07/16/24 at  4:00 PM EST by a video enabled telemedicine application and verified that I am speaking with the correct person using two identifiers.  Location patient: home Location provider:work or home office Persons participating in the virtual visit: patient, provider  I discussed the limitations of evaluation and management by telemedicine and the availability of in person appointments. The patient expressed understanding and agreed to proceed.  Chief Complaint  Patient presents with   Medical Management of Chronic Issues    Patient would like to talk about GLP1    HPI: Pt is a 53 yo male seen for f/u on chronic conditions.  Started Zepbound  a few months ago through [facility], $499 and $150 through the program in order to check in and get refills.  Rx previously given by this provider for GLP-1 was $1200.  Now on Zepboud 15 mg wkly.  Current wt 243 lbs.  Was 283 lbs in April 2025.  Sleeping better.  Back not hurting any more.  Feeling good.  No side effects on med. Not eating a lot.  Not snacking.  Eating leafy greens.  Having more lower bp readings.  As low as 90/?   Normal 105/65-70 in evenings.  Feeling dizzy.  Felt presyncopal after bending down, then standing up.  Taking olmesartan -hydrochlorothiazide  20-12.5 mg daily.   ROS: See pertinent positives and negatives per HPI.  Past Medical History:  Diagnosis Date   Allergy    seasonal   ERECTILE DYSFUNCTION 08/21/2008   HYPERTENSION 05/10/2007   Morbid obesity (HCC)    Pure hypercholesterolemia 04/24/2008    Past Surgical History:  Procedure Laterality Date   HIATAL HERNIA REPAIR  07/22/2012   Procedure: HERNIA REPAIR HIATAL;  Surgeon: Camellia CHRISTELLA Blush, MD,FACS;  Location: [facility];  Service: General;  Laterality: N/A;   LAPAROSCOPIC GASTRIC BANDING  07/22/2012   Procedure: LAPAROSCOPIC GASTRIC BANDING;  Surgeon: Camellia CHRISTELLA Blush, MD,FACS;  Location: [facility];  Service:  General;  Laterality: N/A;   WISDOM TOOTH EXTRACTION      Family History  Problem Relation Age of Onset   Breast cancer Maternal Grandmother    Prostate cancer Paternal Grandfather    Colon cancer Neg Hx    Colon polyps Neg Hx    Esophageal cancer Neg Hx    Rectal cancer Neg Hx    Stomach cancer Neg Hx     Current Outpatient Medications:    atorvastatin  (LIPITOR) 80 MG tablet, TAKE 1 TABLET BY MOUTH ONCE DAILY ., Disp: 90 tablet, Rfl: 3   cyclobenzaprine  (FLEXERIL ) 5 MG tablet, TAKE 1 TABLET BY MOUTH AT BEDTIME AS NEEDED FOR MUSCLE SPASM, Disp: 30 tablet, Rfl: 0   fluticasone  (FLONASE ) 50 MCG/ACT nasal spray, USE ONE SPRAY IN EACH NOSTRIL EVERY DAY, Disp: 16 g, Rfl: 5   loratadine (CLARITIN) 10 MG tablet, Take 10 mg by mouth daily as needed., Disp: , Rfl:    sertraline  (ZOLOFT ) 25 MG tablet, Take 1 tablet by mouth once daily, Disp: 90 tablet, Rfl: 0   sildenafil  (REVATIO ) 20 MG tablet, 2-5 tablets  Daily as needed.  For ED, Disp: 90 tablet, Rfl: 4   tirzepatide  (ZEPBOUND ) 15 MG/0.5ML Pen, Inject 15 mg into the skin once a week., Disp: 2 mL, Rfl: 3   triamcinolone  (NASACORT ) 55 MCG/ACT AERO nasal inhaler, Place 2 sprays into the nose daily., Disp: , Rfl:    triamcinolone  ointment (KENALOG ) 0.1 %, Apply 1 application topically  2 (two) times daily., Disp: 453.6 g, Rfl: 1   Vitamin D , Ergocalciferol , (DRISDOL ) 1.25 MG (50000 UNIT) CAPS capsule, Take 1 capsule (50,000 Units total) by mouth every 7 (seven) days., Disp: 12 capsule, Rfl: 0   olmesartan -hydrochlorothiazide  (BENICAR  HCT) 20-12.5 MG tablet, Take 0.5 tablets by mouth daily., Disp: 45 tablet, Rfl: 3  Current Facility-Administered Medications:    0.9 %  sodium chloride  infusion, 500 mL, Intravenous, Once, Pyrtle, Gordy HERO, MD  EXAM:  VITALS per patient if applicable:  wt 243 lbs, ht 5'6.5  BMI 38.6 kg/m.  GENERAL: alert, oriented, appears well and in no acute distress  HEENT: atraumatic, conjunctiva clear, no obvious  abnormalities on inspection of external nose and ears  NECK: normal movements of the head and neck  LUNGS: on inspection no signs of respiratory distress, breathing rate appears normal, no obvious gross SOB, gasping or wheezing  CV: no obvious cyanosis  MS: moves all visible extremities without noticeable abnormality  PSYCH/NEURO: pleasant and cooperative, no obvious depression or anxiety, speech and thought processing grossly intact  ASSESSMENT AND PLAN:  Discussed the following assessment and plan:  Essential hypertension - Plan: olmesartan -hydrochlorothiazide  (BENICAR  HCT) 20-12.5 MG tablet  History of laparoscopic adjustable gastric banding  Class 2 severe obesity with serious comorbidity and body mass index (BMI) of 38.0 to 38.9 in adult, unspecified obesity type - Plan: tirzepatide  (ZEPBOUND ) 15 MG/0.5ML Pen  Cough due to ACE inhibitor - Plan: olmesartan -hydrochlorothiazide  (BENICAR  HCT) 20-12.5 MG tablet  Symptomatic hypotension on olmesartan -hydrochlorothiazide  20-12.5 mg due to wt loss and decreased intake on Zepbound .  Will have pt take a half tab olemsartan-hydrochlorothiazide  20-12.5 mg daily.  Monitor bp.  For continued hypotension stop med all together. If a bp agent is still needed, can start reduced dose of olemsartan.  Continue lifestyle modifications.  Congratulated on wt loss.  Now 243 lbs, previously 283 lbs on 12/31/23.  Rx for Zepbound  sent to pharmacy to see if covered or cheaper than obtaining through online platform.    F/u in 2-3 months, sooner if needed.    I discussed the assessment and treatment plan with the patient. The patient was provided an opportunity to ask questions and all were answered. The patient agreed with the plan and demonstrated an understanding of the instructions.   The patient was advised to call back or seek an in-person evaluation if the symptoms worsen or if the condition fails to improve as anticipated.   Clotilda JONELLE Single, MD

## 2024-08-02 ENCOUNTER — Other Ambulatory Visit: Payer: Self-pay | Admitting: Family Medicine

## 2024-08-02 DIAGNOSIS — I10 Essential (primary) hypertension: Secondary | ICD-10-CM

## 2024-08-02 DIAGNOSIS — R058 Other specified cough: Secondary | ICD-10-CM

## 2024-09-23 ENCOUNTER — Telehealth: Payer: Self-pay

## 2024-09-23 NOTE — Telephone Encounter (Signed)
 Copied from CRM #[id number]. Topic: Clinical - Prescription Issue >> Sep 23, 2024  9:39 AM Jasmin G wrote: Reason for CRM: Pt states that he was told by [id number] - [place] ([place]), [place] - [address] that zepbound  pen would not be covered and was wondering if prescription could be sent as a vial instead. Call pt back if needed at [phone]. >> Sep 23, 2024  9:53 AM CMA Lila C wrote: Wrong office

## 2024-09-24 ENCOUNTER — Other Ambulatory Visit: Payer: Self-pay | Admitting: Family Medicine

## 2024-09-24 DIAGNOSIS — E66813 Obesity, class 3: Secondary | ICD-10-CM

## 2024-09-24 DIAGNOSIS — F411 Generalized anxiety disorder: Secondary | ICD-10-CM

## 2024-09-24 DIAGNOSIS — F320 Major depressive disorder, single episode, mild: Secondary | ICD-10-CM

## 2024-09-24 MED ORDER — ZEPBOUND 7.5 MG/0.5ML ~~LOC~~ SOLN: 7.5000 mg | SUBCUTANEOUS | 3 refills | Status: DC

## 2024-09-24 NOTE — Telephone Encounter (Signed)
 Rx sent.

## 2024-09-25 ENCOUNTER — Telehealth: Payer: Self-pay

## 2024-09-25 MED ORDER — ZEPBOUND 15 MG/0.5ML ~~LOC~~ SOLN: 15.0000 mg | SUBCUTANEOUS | 3 refills | Status: AC

## 2024-09-25 NOTE — Telephone Encounter (Signed)
 See other Note.

## 2024-09-25 NOTE — Addendum Note (Signed)
 Addended by: MERCER KIRSCH R on: 09/25/2024 01:25 PM   Modules accepted: Orders

## 2024-09-25 NOTE — Telephone Encounter (Signed)
 Copied from CRM #[id number]. Topic: Clinical - Prescription Issue >> Sep 25, 2024 10:35 AM Winona R wrote: Pt states he is now taking tirzepatide  (ZEPBOUND ) 15 MG and need that dose sent to the pharmacy as a injection vial instead of the 7.5 was sent previously.  [id number] - [place] ([place] - [address], [place] ([place]) [place] [address] Phone: [phone]

## 2024-10-04 ENCOUNTER — Other Ambulatory Visit: Payer: Self-pay | Admitting: Family Medicine

## 2024-10-04 DIAGNOSIS — E782 Mixed hyperlipidemia: Secondary | ICD-10-CM
